# Patient Record
Sex: Male | Born: 1940 | Race: White | Hispanic: No | Marital: Married | Smoking: Never smoker
Health system: Southern US, Community
[De-identification: ages and names within clinical notes are randomized; demographics above are authoritative.]

## PROBLEM LIST (undated history)

## (undated) DIAGNOSIS — M109 Gout, unspecified: Secondary | ICD-10-CM

## (undated) DIAGNOSIS — E119 Type 2 diabetes mellitus without complications: Secondary | ICD-10-CM

## (undated) DIAGNOSIS — I10 Essential (primary) hypertension: Secondary | ICD-10-CM

---

## 1967-05-28 HISTORY — PX: FINGER SURGERY: SHX640

## 1987-05-28 HISTORY — PX: BACK SURGERY: SHX140

## 2007-08-13 ENCOUNTER — Emergency Department (HOSPITAL_COMMUNITY): Admission: EM | Admit: 2007-08-13 | Discharge: 2007-08-13 | Payer: Self-pay | Admitting: Emergency Medicine

## 2010-02-20 ENCOUNTER — Encounter: Payer: Self-pay | Admitting: Nurse Practitioner

## 2010-02-24 ENCOUNTER — Encounter: Payer: Self-pay | Admitting: Nurse Practitioner

## 2010-03-27 ENCOUNTER — Encounter: Payer: Self-pay | Admitting: Nurse Practitioner

## 2010-04-26 ENCOUNTER — Encounter: Payer: Self-pay | Admitting: Nurse Practitioner

## 2012-03-30 ENCOUNTER — Encounter (HOSPITAL_COMMUNITY): Payer: Self-pay | Admitting: Emergency Medicine

## 2012-03-30 ENCOUNTER — Emergency Department (HOSPITAL_COMMUNITY): Payer: Medicare Other

## 2012-03-30 ENCOUNTER — Emergency Department (HOSPITAL_COMMUNITY)
Admission: EM | Admit: 2012-03-30 | Discharge: 2012-03-30 | Disposition: A | Discharge status: Home / Self Care | Payer: Medicare Other | Attending: Emergency Medicine | Admitting: Emergency Medicine

## 2012-03-30 DIAGNOSIS — M2540 Effusion, unspecified joint: Secondary | ICD-10-CM | POA: Insufficient documentation

## 2012-03-30 DIAGNOSIS — Z7982 Long term (current) use of aspirin: Secondary | ICD-10-CM | POA: Insufficient documentation

## 2012-03-30 DIAGNOSIS — X58XXXA Exposure to other specified factors, initial encounter: Secondary | ICD-10-CM | POA: Insufficient documentation

## 2012-03-30 DIAGNOSIS — Z79899 Other long term (current) drug therapy: Secondary | ICD-10-CM | POA: Insufficient documentation

## 2012-03-30 DIAGNOSIS — Y9339 Activity, other involving climbing, rappelling and jumping off: Secondary | ICD-10-CM | POA: Insufficient documentation

## 2012-03-30 DIAGNOSIS — S82843A Displaced bimalleolar fracture of unspecified lower leg, initial encounter for closed fracture: Secondary | ICD-10-CM

## 2012-03-30 DIAGNOSIS — E119 Type 2 diabetes mellitus without complications: Secondary | ICD-10-CM | POA: Insufficient documentation

## 2012-03-30 DIAGNOSIS — Y929 Unspecified place or not applicable: Secondary | ICD-10-CM | POA: Insufficient documentation

## 2012-03-30 HISTORY — DX: Type 2 diabetes mellitus without complications: E11.9

## 2012-03-30 MED ORDER — HYDROCODONE-ACETAMINOPHEN 5-325 MG PO TABS: 1.0000 | ORAL_TABLET | Freq: Four times a day (QID) | ORAL | Status: DC | PRN

## 2012-03-30 NOTE — ED Notes (Signed)
Pt c/o pain//swelling to right ankle after falling today.

## 2012-03-30 NOTE — ED Provider Notes (Signed)
History   This chart was scribed for Bobby Jakes, MD, by Frederik Pear. The patient was seen in room APA12/APA12 and the patient's care was started at 1837.    CSN: [id number]  Arrival date & time 03/30/12  4098   First MD Initiated Contact with Patient 03/30/12 1837      Chief Complaint  Patient presents with  . Ankle Pain    (Consider location/radiation/quality/duration/timing/severity/associated sxs/prior treatment) HPI Comments: Bobby Donovan is a 71 y.o. Male with a h/o of DM who presents to the Emergency Department complaining of constant, moderate, non-radiating right ankle pain after trying to jump across a ditch earlier today. He currently takes metformin for his DM.    PCP is Dr. Ninfa Linden.  The history is provided by the patient.    Past Medical History  Diagnosis Date  . Diabetes mellitus without complication     Past Surgical History  Procedure Date  . Back surgery     No family history on file.  History  Substance Use Topics  . Smoking status: Never Smoker   . Smokeless tobacco: Not on file  . Alcohol Use:       Review of Systems  Constitutional: Negative for fever.  HENT: Negative for congestion and sore throat.   Eyes: Negative for visual disturbance.  Respiratory: Negative for cough and shortness of breath.   Cardiovascular: Negative for chest pain.  Gastrointestinal: Negative for nausea, vomiting and diarrhea.  Musculoskeletal: Positive for joint swelling.  Skin: Negative for rash.       Edema  Neurological: Negative for headaches.  All other systems reviewed and are negative.    Allergies  Review of patient's allergies indicates no known allergies.  Home Medications   Current Outpatient Rx  Name  Route  Sig  Dispense  Refill  . AMLODIPINE BESYLATE 5 MG PO TABS   Oral   Take 5 mg by mouth daily with supper.         . ASPIRIN EC 81 MG PO TBEC   Oral   Take 81 mg by mouth daily with supper.         .  IRBESARTAN 300 MG PO TABS   Oral   Take 300 mg by mouth daily with supper.         Marland Kitchen LOSARTAN POTASSIUM 100 MG PO TABS   Oral   Take 100 mg by mouth daily with supper.         Marland Kitchen METFORMIN HCL 500 MG PO TABS   Oral   Take 1,000 mg by mouth daily with supper.         Marland Kitchen PIOGLITAZONE HCL 30 MG PO TABS   Oral   Take 30 mg by mouth daily with supper.         Marland Kitchen ROSUVASTATIN CALCIUM 20 MG PO TABS   Oral   Take 20 mg by mouth daily with supper.         Marland Kitchen HYDROCODONE-ACETAMINOPHEN 5-325 MG PO TABS   Oral   Take 1-2 tablets by mouth every 6 (six) hours as needed for pain.   10 tablet   0     BP 175/87  Pulse 100  Temp 98.4 F (36.9 C)  Resp 20  Ht 5\' 7"  (1.702 m)  Wt 170 lb (77.111 kg)  BMI 26.63 kg/m2  SpO2 98%  Physical Exam  Constitutional: He is oriented to person, place, and time. He appears well-developed and well-nourished.  Eyes: Conjunctivae normal  and EOM are normal. Pupils are equal, round, and reactive to light. Right eye exhibits no discharge. Left eye exhibits no discharge. No scleral icterus.  Neck: Normal range of motion.  Cardiovascular: Normal rate, regular rhythm and normal heart sounds.   No murmur heard. Pulmonary/Chest: Effort normal and breath sounds normal. No respiratory distress. He has no wheezes. He has no rales. He exhibits no tenderness.  Abdominal: Soft. Bowel sounds are normal. He exhibits no mass. There is no tenderness. There is no rebound and no guarding.  Musculoskeletal: He exhibits edema.       Right hip: He exhibits no tenderness.       Left hip: He exhibits no tenderness.       Swelling medially and laterally around the right ankle with ecchymosis developing. No proximal fibula tenderness.  Neurological: He is alert and oriented to person, place, and time. No cranial nerve deficit. He exhibits normal muscle tone. Coordination normal.  Skin: No rash noted.       Capillary refill is 1 sec. on his right great toe.    ED Course   Procedures (including critical care time)  DIAGNOSTIC STUDIES: Oxygen Saturation is 98% on room air, normal by my interpretation.    COORDINATION OF CARE:  19:06- Discussed planned course of treatment with the patient, including a consult with an orthopaedic surgeon and a possible splint, who is agreeable at this time.  19:34- Consult with orthopaedic surgeon to schedule an appointment at 0830 tomorrow.  Labs Reviewed - No data to display Dg Ankle Complete Right  03/30/2012  *RADIOLOGY REPORT*  Clinical Data: Fall tonight. Rt ankle pain and swelling  RIGHT ANKLE - COMPLETE 3+ VIEW  Comparison: 08/13/2007  Findings: Comminuted and fragmented lateral malleolar fracture noted, primarily with oblique morphology of the dominant plane but with multiple fragments along the main fracture plane, with lateral displacement of the distal fragments. Primarily transverse fracture of the medial malleolus.  Tibial and fibular shafts are displaced medially with respect to the talus.  No discrete talar dome lesion noted.  A well-defined posterior malleolar fracture is not well seen on the lateral projection.  Vascular calcifications noted.  Adjacent soft tissue swelling is present.  IMPRESSION:  1.  Bimalleolar fracture, with particular comminution of the lateral malleolar component, most compatible with Lauge-Hansen supination - external rotation stage IV injury.   Original Report Authenticated By: Gaylyn Rong, M.D.      1. Bimalleolar ankle fracture       MDM  Right bimlleolar  ankle fracture discussed with Dr. Romeo Apple he will see the patient is off this tomorrow at 8:30 in the morning. We'll do a stirrup splint and provide crutches patient will elevate the leg. No other injuries.       I personally performed the services described in this documentation, which was scribed in my presence. The recorded information has been reviewed and considered.          Bobby Jakes,  MD 03/30/12 [phone]

## 2012-03-30 NOTE — ED Notes (Signed)
Pt with pain to right ankle after jumping a ditch today and fell, states that his right ankle had twisted with landing, unable to place weight on ankle

## 2012-03-31 ENCOUNTER — Encounter: Payer: Self-pay | Admitting: Orthopedic Surgery

## 2012-03-31 ENCOUNTER — Encounter (HOSPITAL_COMMUNITY): Payer: Self-pay | Admitting: Pharmacy Technician

## 2012-03-31 ENCOUNTER — Ambulatory Visit (INDEPENDENT_AMBULATORY_CARE_PROVIDER_SITE_OTHER): Payer: Medicare Other | Admitting: Orthopedic Surgery

## 2012-03-31 ENCOUNTER — Telehealth: Payer: Self-pay | Admitting: Orthopedic Surgery

## 2012-03-31 DIAGNOSIS — S82843A Displaced bimalleolar fracture of unspecified lower leg, initial encounter for closed fracture: Secondary | ICD-10-CM

## 2012-03-31 NOTE — Telephone Encounter (Signed)
Contact to insurers regarding out-patient surgery scheduled at [facility] on 04/03/12, repair of right ankle fracture, CPT [phone].  Per Medicare guidelines, no pre-authorization required; per [facility], ph [phone]:

## 2012-03-31 NOTE — Patient Instructions (Addendum)
20 Bobby Donovan  03/31/2012   Your procedure is scheduled on:  04/03/2012  Report to [facility] at  1130  AM.  Call this number if you have problems the morning of surgery: [phone]   Remember:   Do not eat food:After Midnight.  May have clear liquids:until Midnight .    Take these medicines the morning of surgery with A SIP OF WATER:  Norco,allopurinol,norvasc   Do not wear jewelry, make-up or nail polish.  Do not wear lotions, powders, or perfumes.   Do not shave 48 hours prior to surgery. Men may shave face and neck.  Do not bring valuables to the hospital.  Contacts, dentures or bridgework may not be worn into surgery.  Leave suitcase in the car. After surgery it may be brought to your room.  For patients admitted to the hospital, checkout time is 11:00 AM the day of discharge.   Patients discharged the day of surgery will not be allowed to drive home.  Name and phone number of your driver: family  Special Instructions: Shower using CHG 2 nights before surgery and the night before surgery.  If you shower the day of surgery use CHG.  Use special wash - you have one bottle of CHG for all showers.  You should use approximately 1/3 of the bottle for each shower.   Please read over the following fact sheets that you were given: Pain Booklet, Coughing and Deep Breathing, Surgical Site Infection Prevention, Anesthesia Post-op Instructions and Care and Recovery After Surgery Displaced Fibular Fracture (Adult, Ankle) Treated with ORIF You have a fracture (break) of your fibula at the end of this bone that makes up part of your ankle. This is the bone in your lower leg located on the outside of the leg and it makes up the bump you feel on the outside of your ankle. The fracture you have is displaced. This means the bones are not in good healing position. Your displaced fracture is at the part of the fibula that is located at the ankle. Because of this the bones must be put back into  position surgically. This is called ORIF. ORIF stands for Open Reduction and Internal Fixation. This surgical repair of your ankle will give the best chance for your ankle to heal right and decrease the chances of later arthritis and disability, which may occur with even the best of treatment and care. These fractures are easily diagnosed with x-rays. LET YOUR CAREGIVER KNOW ABOUT:  Allergies  Medications taken including herbs, eye drops, over the counter medications, and creams  Use of steroids (by mouth or creams)  History of bleeding or blood problems  Previous problems with anesthetics or novocaine  History of blood clots (thrombophlebitis)  Previous surgery.  Other health problems  Family history of anesthetic problems.  Possibility of pregnancy, if this applies RISKS AND COMPLICATIONS All surgery is associated with risks. Some of these risks are:  Excessive bleeding.  Infection.  Post traumatic arthritis.  Failure to heal properly resulting in an unstable ankle.  Stiffness of ankle following repair. BEFORE THE PROCEDURE Prior to surgery an IV (intravenous line connected to your vein for giving fluids) may be started and you will be given an anesthetic (medications and gas to make you sleep).  AFTER THE PROCEDURE This is usually an outpatient procedure. This means you will be released from the hospital the same day. You will receive physical therapy and other care until you are doing  well. HOME CARE INSTRUCTIONS   You may resume normal diet and activities as directed or allowed.  Keep ice packs (a bag of ice wrapped in a towel) on the surgical area for twenty minutes, four times per day, for the first two days following surgery.  Change dressings if necessary or as directed.  If you have a plaster or fiberglass cast:  Do not try to scratch the skin under the cast using sharp or pointed objects.  Check the skin around the cast every day. You may put lotion on any  red or sore areas.  Keep your cast dry and clean.  Do not put pressure on any part of your cast or splint until it is fully hardened.  Your cast or splint can be protected during bathing with a plastic bag. Do not lower the cast or splint into water.  Only take over-the-counter or prescription medicines for pain, discomfort, or fever as directed by your caregiver.  Use crutches as directed and do not exercise leg unless instructed.  Your caregiver may instruct you to remove your cam boot.  These are not fractures to be taken lightly! If these bones become displaced and get out of position, it may eventually lead to arthritis and disability. Problems often follow even the best of care. Follow the directions of your caregiver.  Keep appointments as directed.  Warning: Do not drive a car or operate a motor vehicle until your caregiver specifically tells you it is safe to do so. SEEK IMMEDIATE MEDICAL CARE IF:   Redness, swelling, or increasing pain in the wound.  Pus coming from wound.  An unexplained oral temperature above 102 F (38.9 C) develops.  A bad smell coming from the wound or dressing.  A breaking open of the wound (edges not staying together) after sutures or staples have been removed.  Numbness in the foot that is getting worse.  Severe pain when you move your toes. If you do not have a window in your cast for observing the wound, a discharge or minor bleeding may show up as a stain on the outside of your cast. Report these findings to your caregiver. MAKE SURE YOU:   Understand these instructions.  Will watch your condition.  Will get help right away if you are not doing well or get worse. Document Released: 05/13/2005 Document Revised: 08/05/2011 Document Reviewed: 08/18/2007 [facility] Patient Information 2013 [place], [place]. PATIENT INSTRUCTIONS POST-ANESTHESIA  IMMEDIATELY FOLLOWING SURGERY:  Do not drive or operate machinery for the first twenty four  hours after surgery.  Do not make any important decisions for twenty four hours after surgery or while taking narcotic pain medications or sedatives.  If you develop intractable nausea and vomiting or a severe headache please notify your doctor immediately.  FOLLOW-UP:  Please make an appointment with your surgeon as instructed. You do not need to follow up with anesthesia unless specifically instructed to do so.  WOUND CARE INSTRUCTIONS (if applicable):  Keep a dry clean dressing on the anesthesia/puncture wound site if there is drainage.  Once the wound has quit draining you may leave it open to air.  Generally you should leave the bandage intact for twenty four hours unless there is drainage.  If the epidural site drains for more than 36-48 hours please call the anesthesia department.  QUESTIONS?:  Please feel free to call your physician or the hospital operator if you have any questions, and they will be happy to assist you.

## 2012-03-31 NOTE — Progress Notes (Signed)
Patient ID: Bobby Donovan, male   DOB: 08/24/1940, 70 y.o.   MRN: [id number] Chief Complaint   Patient presents with   .  Ankle Injury       Right ankle fracture, DOI 03-30-12.        70-year-old male with diabetes tried to jump over a small remaining twisted his ankle felt immediate pain and swelling with deformity and went to the ER. In the ER x-rays show bimalleolar ankle fracture with slight lateral displacement comminuted fibular fracture and a displaced medial malleolar fracture.   He complains of pain swelling inability to weight-bear located over the medial lateral aspects of his right ankle.    Past Medical History   Diagnosis  Date   .  Diabetes mellitus without complication           Past Surgical History   Procedure  Date   .  Back surgery           Current Outpatient Prescriptions on File Prior to Visit   Medication  Sig  Dispense  Refill   .  amLODipine (NORVASC) 5 MG tablet  Take 5 mg by mouth daily with supper.         .  aspirin EC 81 MG tablet  Take 81 mg by mouth daily with supper.         .  losartan (COZAAR) 100 MG tablet  Take 100 mg by mouth daily with supper.         .  metFORMIN (GLUCOPHAGE) 500 MG tablet  Take 1,000 mg by mouth daily with supper.         .  pioglitazone (ACTOS) 30 MG tablet  Take 30 mg by mouth daily with supper.         .  rosuvastatin (CRESTOR) 20 MG tablet  Take 20 mg by mouth daily with supper.         .  allopurinol (ZYLOPRIM) 300 MG tablet  Take 300 mg by mouth daily with supper.         .  HYDROcodone-acetaminophen (NORCO/VICODIN) 5-325 MG per tablet  Take 1-2 tablets by mouth every 6 (six) hours as needed for pain.   10 tablet   0         History   Substance Use Topics   .  Smoking status:  Never Smoker    .  Smokeless tobacco:  Not on file   .  Alcohol Use:         Family History   Problem  Relation  Age of Onset   .  Diabetes          Physical Exam  Nursing note and vitals reviewed. Constitutional: He is  oriented to person, place, and time. He appears well-developed and well-nourished. No distress.  HENT:   Head: Normocephalic and atraumatic.   Nose: Nose normal.  Eyes: Pupils are equal, round, and reactive to light. Right eye exhibits no discharge. Left eye exhibits no discharge.  Neck: Normal range of motion.  Cardiovascular: Normal rate and intact distal pulses.   Pulmonary/Chest: Breath sounds normal. He has no wheezes. He has no rales.  Abdominal: He exhibits no distension.  Musculoskeletal:       Upper extremity exam  Inspection and palpation revealed no abnormalities in the upper extremities.  Range of motion is full without contracture.  Motor exam is normal with grade 5 strength.  The joints are fully reduced without subluxation.  There is no atrophy   or tremor and muscle tone is normal.  All joints are stable.    The left lower extremity is without contracture subluxation atrophy tremor or malalignment  Right lower extremity swollen tender medially and laterally no fracture blisters skin intact decreased range of motion decreased strength. No joint subluxation. Muscle tone normal.  Neurological: He is alert and oriented to person, place, and time. He has normal reflexes.  Skin: Skin is warm and dry. No rash noted. He is not diaphoretic. No erythema. No pallor.  Psychiatric: He has a normal mood and affect. His behavior is normal. Judgment and thought content normal.    Hospital films medial malleolar fracture lateral malleolar fracture displacement of the mortise   Diagnosis closed bimalleolar right ankle fracture   Plan open treatment internal fixation right ankle   Patient splint reapplied   Surgery scheduled for Friday      

## 2012-03-31 NOTE — Patient Instructions (Addendum)
Surgery right ankle: Friday   Bimalleolar Fracture, Ankle, Adult, Displaced (ORIF) A bimalleolar fracture (break in bone) is two fractures in the lower bones of your leg that help to make up your ankle. These fractures are in the bone you feel as the bump on the outside of your ankle (fibula) and the bone that you feel as the bump on the inside of your ankle (tibia). Your fractures are displaced. This means the bones are not in their normal position and will not give a good result if they heal in that position. Because of this, surgery is required. This is called an open reduction and internal fixation (ORIF). Even with the best of care and perfect results this ankle may be more prone to be arthritis later due to damage of the cartilage lining the ankle joint which is not visible on X-ray. These fractures are easily diagnosed with X-rays. TREATMENT   You have fractures that would probably heal with disability, without surgery. Open reduction means that the area of the fracture is opened up to the vision of the surgeon and internal fixation means that a screw, pins or fixation device is used to hold the boney pieces in place. Following surgery a short-leg cast or removable fracture boot is then applied from your toes to below your knee. This is generally left in place for about 5 to 6 weeks, during which time it is followed by your caregiver and X-rays may be taken to make sure the bones stay in place. RISKS & COMPLICATIONS: All surgery is associated with risks. Some of these risks are:  Excessive bleeding   Infection   Failure to heal properly resulting in an unstable or arthritic ankle   Stiffness of ankle following repair  LET YOUR CAREGIVERS KNOW ABOUT:  Allergies.   Medications taken including herbs, eye drops, over-the-counter medications, and creams.   Use of steroids (by mouth or creams).   History of bleeding or blood problems.   Previous problems with anesthetics or numbing  medication, including a family history of these problems.   Possibility of pregnancy, if this applies.   History of blood clots (thrombophlebitis).   Previous surgery.   Other health problems.  BEFORE AND AFTER YOUR SURGERY Prior to surgery an IV (intravenous line connected to your vein for giving fluids) may be started and you will be given an anesthetic (medications and gas to make you sleep). You may also be given a regional anesthetic such as a spinal or epidural block. After surgery, you will be taken to the recovery area where a nurse will monitor your progress. You may have a catheter (a long, narrow, hollow tube) in your bladder following surgery that helps you pass your water. When you are awake, are stable, taking fluids well and without complications, you will be returned to your room. You will receive physical therapy and other care until you are doing well and your caregiver feels it is safe for you to be transferred either to home or to an extended care facility. HOME CARE INSTRUCTIONS    You may resume normal diet and activities as directed or allowed.   Do not drive a vehicle until your caregiver specifically tells you it is safe to do so.   Keep ice packs (a bag of ice wrapped in a towel) on the surgical area for 20 minutes, 4 times per day, for the first two days following surgery. Use the ice only if okay with your surgeon or caregiver.  Elevate your ankle above your heart as much as possible for the first 24 to 48 hours after the operation.   Change dressings if necessary or as directed.   If you have a plaster or fiberglass cast:   Do not try to scratch the skin under the cast using sharp or pointed objects.   Check the skin around the cast every day. You may put lotion on any red or sore areas.   Keep your cast dry and clean.   Do not put pressure on any part of your cast or splint until it is fully hardened.   Your cast or splint can be protected during  bathing with a plastic bag. Do not lower the cast or splint into water.   Only take over-the-counter or prescription medicines for pain, discomfort, or fever as directed by your caregiver.   Use crutches as directed and do not exercise leg unless instructed.   These are not fractures to be taken lightly! If these bones become displaced and get out of position, it may eventually lead to arthritis and disability for the rest of your life. Problems often follow even the best of care. Follow the directions of your caregiver.   Keep appointments as directed.  SEEK IMMEDIATE MEDICAL CARE IF:    Redness, swelling, numbness, or increasing pain in the wound.   Pus coming from wound.   An unexplained oral temperature above 102 F (38.9 C) or as your caregiver suggests.   A bad smell coming from the wound or dressing.   A breaking open of the wound (edges not staying together) after sutures or staples have been removed.   Your skin or nails below the injury turn blue or gray, or feel cold or numb.   You develop severe pain under the cast or in your foot. Especially when someone else moves your toes.  Follow all instructions given to you by your caregiver, make and keep follow up appointments, and use crutches as directed. If you do not have a window in your cast for observing the wound, a discharge, or minor bleeding may show up as a stain on the outside of your dressings, your cast, or plaster splint. Report these findings to your caregiver. MAKE SURE YOU:    Understand these instructions.   Will watch your condition.   Will get help right away if you are not doing well or get worse.  Document Released: 02/20/2005 Document Revised: 08/05/2011 Document Reviewed: 12/16/2007 [facility] Patient Information 2013 [place], [place].

## 2012-03-31 NOTE — H&P (Signed)
Patient ID: Bobby Donovan, male   DOB: 07/08/1940, 70 y.o.   MRN: [id number] Chief Complaint   Patient presents with   .  Ankle Injury       Right ankle fracture, DOI 03-30-12.        70-year-old male with diabetes tried to jump over a small remaining twisted his ankle felt immediate pain and swelling with deformity and went to the ER. In the ER x-rays show bimalleolar ankle fracture with slight lateral displacement comminuted fibular fracture and a displaced medial malleolar fracture.   He complains of pain swelling inability to weight-bear located over the medial lateral aspects of his right ankle.    Past Medical History   Diagnosis  Date   .  Diabetes mellitus without complication           Past Surgical History   Procedure  Date   .  Back surgery           Current Outpatient Prescriptions on File Prior to Visit   Medication  Sig  Dispense  Refill   .  amLODipine (NORVASC) 5 MG tablet  Take 5 mg by mouth daily with supper.         .  aspirin EC 81 MG tablet  Take 81 mg by mouth daily with supper.         .  losartan (COZAAR) 100 MG tablet  Take 100 mg by mouth daily with supper.         .  metFORMIN (GLUCOPHAGE) 500 MG tablet  Take 1,000 mg by mouth daily with supper.         .  pioglitazone (ACTOS) 30 MG tablet  Take 30 mg by mouth daily with supper.         .  rosuvastatin (CRESTOR) 20 MG tablet  Take 20 mg by mouth daily with supper.         .  allopurinol (ZYLOPRIM) 300 MG tablet  Take 300 mg by mouth daily with supper.         .  HYDROcodone-acetaminophen (NORCO/VICODIN) 5-325 MG per tablet  Take 1-2 tablets by mouth every 6 (six) hours as needed for pain.   10 tablet   0         History   Substance Use Topics   .  Smoking status:  Never Smoker    .  Smokeless tobacco:  Not on file   .  Alcohol Use:         Family History   Problem  Relation  Age of Onset   .  Diabetes          Physical Exam  Nursing note and vitals reviewed. Constitutional: He is  oriented to person, place, and time. He appears well-developed and well-nourished. No distress.  HENT:   Head: Normocephalic and atraumatic.   Nose: Nose normal.  Eyes: Pupils are equal, round, and reactive to light. Right eye exhibits no discharge. Left eye exhibits no discharge.  Neck: Normal range of motion.  Cardiovascular: Normal rate and intact distal pulses.   Pulmonary/Chest: Breath sounds normal. He has no wheezes. He has no rales.  Abdominal: He exhibits no distension.  Musculoskeletal:       Upper extremity exam  Inspection and palpation revealed no abnormalities in the upper extremities.  Range of motion is full without contracture.  Motor exam is normal with grade 5 strength.  The joints are fully reduced without subluxation.  There is no atrophy   or tremor and muscle tone is normal.  All joints are stable.    The left lower extremity is without contracture subluxation atrophy tremor or malalignment  Right lower extremity swollen tender medially and laterally no fracture blisters skin intact decreased range of motion decreased strength. No joint subluxation. Muscle tone normal.  Neurological: He is alert and oriented to person, place, and time. He has normal reflexes.  Skin: Skin is warm and dry. No rash noted. He is not diaphoretic. No erythema. No pallor.  Psychiatric: He has a normal mood and affect. His behavior is normal. Judgment and thought content normal.    Hospital films medial malleolar fracture lateral malleolar fracture displacement of the mortise   Diagnosis closed bimalleolar right ankle fracture   Plan open treatment internal fixation right ankle   Patient splint reapplied   Surgery scheduled for Friday      

## 2012-04-01 ENCOUNTER — Encounter (HOSPITAL_COMMUNITY)
Admission: RE | Admit: 2012-04-01 | Discharge: 2012-04-01 | Disposition: A | Discharge status: Home / Self Care | Payer: Medicare Other | Source: Ambulatory Visit | Attending: Orthopedic Surgery | Admitting: Orthopedic Surgery

## 2012-04-01 ENCOUNTER — Encounter (HOSPITAL_COMMUNITY): Payer: Self-pay

## 2012-04-01 HISTORY — DX: Essential (primary) hypertension: I10

## 2012-04-01 HISTORY — DX: Gout, unspecified: M10.9

## 2012-04-01 LAB — SURGICAL PCR SCREEN
MRSA, PCR: NEGATIVE
Staphylococcus aureus: NEGATIVE

## 2012-04-01 LAB — BASIC METABOLIC PANEL
CO2: 27 mEq/L (ref 19–32)
Calcium: 9.3 mg/dL (ref 8.4–10.5)
GFR calc non Af Amer: 44 mL/min — ABNORMAL LOW (ref >90)
Sodium: 139 mEq/L (ref 135–145)

## 2012-04-01 LAB — CBC
Platelets: 170 10*3/uL (ref 150–400)
RBC: 3.13 MIL/uL — ABNORMAL LOW (ref 4.22–5.81)
WBC: 6.3 10*3/uL (ref 4.0–10.5)

## 2012-04-01 MED ORDER — CHLORHEXIDINE GLUCONATE 4 % EX LIQD: 60.0000 mL | Freq: Once | CUTANEOUS | Status: DC

## 2012-04-01 NOTE — Telephone Encounter (Signed)
04/01/12 - Re-verified with representative at [facility], Tangamika J, in addition to automated voice response system message, confirmed that no pre-authorization is required for surgery (CPT code 16109.)

## 2012-04-01 NOTE — Progress Notes (Signed)
04/01/12 0851  OBSTRUCTIVE SLEEP APNEA  Have you ever been diagnosed with sleep apnea through a sleep study? No  Do you snore loudly (loud enough to be heard through closed doors)?  1  Do you often feel tired, fatigued, or sleepy during the daytime? 0  Has anyone observed you stop breathing during your sleep? 1  Do you have, or are you being treated for high blood pressure? 1  BMI more than 35 kg/m2? 0  Age over 70 years old? 1  Neck circumference greater than 40 cm/18 inches? 0  Gender: 1  Obstructive Sleep Apnea Score 5   Score 4 or greater  Results sent to PCP

## 2012-04-01 NOTE — Pre-Procedure Instructions (Signed)
Dr Romeo Apple notified of Hem 10, Hct 30.7. No orders given at this time.

## 2012-04-03 ENCOUNTER — Encounter (HOSPITAL_COMMUNITY)
Admission: RE | Disposition: A | Discharge status: Home / Self Care | Payer: Self-pay | Source: Ambulatory Visit | Attending: Orthopedic Surgery

## 2012-04-03 ENCOUNTER — Ambulatory Visit (HOSPITAL_COMMUNITY): Payer: Medicare Other | Admitting: Anesthesiology

## 2012-04-03 ENCOUNTER — Ambulatory Visit (HOSPITAL_COMMUNITY)
Admission: RE | Admit: 2012-04-03 | Discharge: 2012-04-03 | Disposition: A | Discharge status: Home / Self Care | Payer: Medicare Other | Source: Ambulatory Visit | Attending: Orthopedic Surgery | Admitting: Orthopedic Surgery

## 2012-04-03 ENCOUNTER — Encounter (HOSPITAL_COMMUNITY): Payer: Self-pay | Admitting: *Deleted

## 2012-04-03 ENCOUNTER — Ambulatory Visit (HOSPITAL_COMMUNITY): Payer: Medicare Other

## 2012-04-03 ENCOUNTER — Encounter (HOSPITAL_COMMUNITY): Payer: Self-pay | Admitting: Anesthesiology

## 2012-04-03 DIAGNOSIS — E119 Type 2 diabetes mellitus without complications: Secondary | ICD-10-CM | POA: Insufficient documentation

## 2012-04-03 DIAGNOSIS — I10 Essential (primary) hypertension: Secondary | ICD-10-CM | POA: Insufficient documentation

## 2012-04-03 DIAGNOSIS — S82843A Displaced bimalleolar fracture of unspecified lower leg, initial encounter for closed fracture: Secondary | ICD-10-CM | POA: Insufficient documentation

## 2012-04-03 DIAGNOSIS — Z01810 Encounter for preprocedural cardiovascular examination: Secondary | ICD-10-CM | POA: Insufficient documentation

## 2012-04-03 DIAGNOSIS — Z01812 Encounter for preprocedural laboratory examination: Secondary | ICD-10-CM | POA: Insufficient documentation

## 2012-04-03 DIAGNOSIS — X500XXA Overexertion from strenuous movement or load, initial encounter: Secondary | ICD-10-CM | POA: Insufficient documentation

## 2012-04-03 HISTORY — PX: ORIF ANKLE FRACTURE: SHX5408

## 2012-04-03 HISTORY — PX: ANKLE FRACTURE SURGERY: SHX122

## 2012-04-03 SURGERY — OPEN REDUCTION INTERNAL FIXATION (ORIF) ANKLE FRACTURE
Anesthesia: Spinal | Site: Ankle | Laterality: Right | Wound class: Clean

## 2012-04-03 MED ORDER — BUPIVACAINE IN DEXTROSE 0.75-8.25 % IT SOLN
INTRATHECAL | Status: DC | PRN
  Administered 2012-04-03: 15 mg via INTRATHECAL

## 2012-04-03 MED ORDER — BUPIVACAINE-EPINEPHRINE PF 0.5-1:200000 % IJ SOLN
INTRAMUSCULAR | Status: AC
  Filled 2012-04-03: qty 20

## 2012-04-03 MED ORDER — FENTANYL CITRATE 0.05 MG/ML IJ SOLN
INTRAMUSCULAR | Status: AC
  Filled 2012-04-03: qty 2

## 2012-04-03 MED ORDER — MIDAZOLAM HCL 2 MG/2ML IJ SOLN
1.0000 mg | INTRAMUSCULAR | Status: DC | PRN
  Administered 2012-04-03: 1 mg via INTRAVENOUS
  Administered 2012-04-03: 2 mg via INTRAVENOUS

## 2012-04-03 MED ORDER — PROMETHAZINE HCL 12.5 MG PO TABS: 60.0000 mg | ORAL_TABLET | ORAL | Status: DC | PRN

## 2012-04-03 MED ORDER — PROPOFOL 10 MG/ML IV EMUL
INTRAVENOUS | Status: DC | PRN
  Administered 2012-04-03: 15:00:00 via INTRAVENOUS
  Administered 2012-04-03: 3 ug/kg/min via INTRAVENOUS

## 2012-04-03 MED ORDER — OXYCODONE-ACETAMINOPHEN 5-325 MG PO TABS: 1.0000 | ORAL_TABLET | ORAL | Status: DC | PRN

## 2012-04-03 MED ORDER — MIDAZOLAM HCL 2 MG/2ML IJ SOLN
INTRAMUSCULAR | Status: AC
  Filled 2012-04-03: qty 2

## 2012-04-03 MED ORDER — PROPOFOL INFUSION 10 MG/ML OPTIME: INTRAVENOUS | Status: DC | PRN

## 2012-04-03 MED ORDER — BUPIVACAINE-EPINEPHRINE PF 0.5-1:200000 % IJ SOLN
INTRAMUSCULAR | Status: DC | PRN
  Administered 2012-04-03: 60 mL

## 2012-04-03 MED ORDER — ONDANSETRON HCL 4 MG/2ML IJ SOLN: 4.0000 mg | Freq: Once | INTRAMUSCULAR | Status: DC | PRN

## 2012-04-03 MED ORDER — PROPOFOL 10 MG/ML IV EMUL
INTRAVENOUS | Status: AC
  Filled 2012-04-03: qty 20

## 2012-04-03 MED ORDER — EPHEDRINE SULFATE 50 MG/ML IJ SOLN
INTRAMUSCULAR | Status: AC
  Filled 2012-04-03: qty 1

## 2012-04-03 MED ORDER — CEFAZOLIN SODIUM-DEXTROSE 2-3 GM-% IV SOLR
INTRAVENOUS | Status: AC
  Filled 2012-04-03: qty 50

## 2012-04-03 MED ORDER — FENTANYL CITRATE 0.05 MG/ML IJ SOLN
INTRAMUSCULAR | Status: DC | PRN
  Administered 2012-04-03: 25 ug via INTRAVENOUS

## 2012-04-03 MED ORDER — SODIUM CHLORIDE 0.9 % IR SOLN
Status: DC | PRN
  Administered 2012-04-03: 1000 mL

## 2012-04-03 MED ORDER — MIDAZOLAM HCL 5 MG/5ML IJ SOLN
INTRAMUSCULAR | Status: DC | PRN
  Administered 2012-04-03 (×2): 1 mg via INTRAVENOUS

## 2012-04-03 MED ORDER — ACETAMINOPHEN 10 MG/ML IV SOLN
1000.0000 mg | Freq: Once | INTRAVENOUS | Status: AC
  Administered 2012-04-03: 1000 mg via INTRAVENOUS

## 2012-04-03 MED ORDER — CEFAZOLIN SODIUM-DEXTROSE 2-3 GM-% IV SOLR
INTRAVENOUS | Status: DC | PRN
  Administered 2012-04-03: 2 g via INTRAVENOUS

## 2012-04-03 MED ORDER — LACTATED RINGERS IV SOLN
INTRAVENOUS | Status: DC
  Administered 2012-04-03 (×2): via INTRAVENOUS

## 2012-04-03 MED ORDER — OXYCODONE HCL 5 MG PO TABS
ORAL_TABLET | ORAL | Status: AC
  Filled 2012-04-03: qty 1

## 2012-04-03 MED ORDER — CEFAZOLIN SODIUM-DEXTROSE 2-3 GM-% IV SOLR: 2.0000 g | INTRAVENOUS | Status: DC

## 2012-04-03 MED ORDER — BUPIVACAINE IN DEXTROSE 0.75-8.25 % IT SOLN
INTRATHECAL | Status: AC
  Filled 2012-04-03: qty 2

## 2012-04-03 MED ORDER — FENTANYL CITRATE 0.05 MG/ML IJ SOLN: 25.0000 ug | INTRAMUSCULAR | Status: DC | PRN

## 2012-04-03 MED ORDER — OXYCODONE HCL 5 MG PO TABS
5.0000 mg | ORAL_TABLET | Freq: Once | ORAL | Status: AC
  Administered 2012-04-03: 5 mg via ORAL

## 2012-04-03 MED ORDER — ACETAMINOPHEN 10 MG/ML IV SOLN
INTRAVENOUS | Status: AC
  Filled 2012-04-03: qty 100

## 2012-04-03 MED ORDER — EPHEDRINE SULFATE 50 MG/ML IJ SOLN
INTRAMUSCULAR | Status: DC | PRN
  Administered 2012-04-03: 5 mg via INTRAVENOUS

## 2012-04-03 SURGICAL SUPPLY — 58 items
BAG HAMPER (MISCELLANEOUS) ×2 IMPLANT
BANDAGE ELASTIC 4 VELCRO NS (GAUZE/BANDAGES/DRESSINGS) ×2 IMPLANT
BANDAGE ELASTIC 6 VELCRO NS (GAUZE/BANDAGES/DRESSINGS) ×2 IMPLANT
BANDAGE ESMARK 4X12 BL STRL LF (DISPOSABLE) ×1 IMPLANT
BIT DRILL CANN 2.7 (BIT) ×1
BIT DRILL SRG 2.7XCANN AO CPLG (BIT) ×1 IMPLANT
BIT DRL SRG 2.7XCANN AO CPLNG (BIT) ×1
BLADE SURG SZ10 CARB STEEL (BLADE) ×2 IMPLANT
BNDG COHESIVE 4X5 TAN STRL (GAUZE/BANDAGES/DRESSINGS) ×2 IMPLANT
BNDG ESMARK 4X12 BLUE STRL LF (DISPOSABLE) ×2
CHLORAPREP W/TINT 26ML (MISCELLANEOUS) ×4 IMPLANT
CLOTH BEACON ORANGE TIMEOUT ST (SAFETY) ×2 IMPLANT
COVER LIGHT HANDLE STERIS (MISCELLANEOUS) ×4 IMPLANT
CUFF TOURNIQUET SINGLE 34IN LL (TOURNIQUET CUFF) ×2 IMPLANT
DRAPE C-ARM FOLDED MOBILE STRL (DRAPES) ×2 IMPLANT
DRAPE PROXIMA HALF (DRAPES) ×2 IMPLANT
DRILL 2.6X122MM WL AO SHAFT (BIT) ×2 IMPLANT
GAUZE XEROFORM 5X9 LF (GAUZE/BANDAGES/DRESSINGS) ×4 IMPLANT
GLOVE BIOGEL PI IND STRL 7.0 (GLOVE) ×2 IMPLANT
GLOVE BIOGEL PI INDICATOR 7.0 (GLOVE) ×2
GLOVE ECLIPSE 6.5 STRL STRAW (GLOVE) ×2 IMPLANT
GLOVE EXAM NITRILE MD LF STRL (GLOVE) ×2 IMPLANT
GLOVE SKINSENSE NS SZ8.0 LF (GLOVE) ×1
GLOVE SKINSENSE STRL SZ8.0 LF (GLOVE) ×1 IMPLANT
GLOVE SS BIOGEL STRL SZ 6.5 (GLOVE) ×1 IMPLANT
GLOVE SS N UNI LF 8.5 STRL (GLOVE) ×2 IMPLANT
GLOVE SUPERSENSE BIOGEL SZ 6.5 (GLOVE) ×1
GOWN STRL REIN XL XLG (GOWN DISPOSABLE) ×4 IMPLANT
INST SET MINOR BONE (KITS) ×2 IMPLANT
K-WIRE ORTHOPEDIC 1.4X150L (WIRE) ×4
KIT ROOM TURNOVER APOR (KITS) ×2 IMPLANT
KWIRE ORTHOPEDIC 1.4X150L (WIRE) ×2 IMPLANT
MANIFOLD NEPTUNE II (INSTRUMENTS) ×2 IMPLANT
NEEDLE HYPO 21X1.5 SAFETY (NEEDLE) ×2 IMPLANT
NS IRRIG 1000ML POUR BTL (IV SOLUTION) ×4 IMPLANT
PACK BASIC LIMB (CUSTOM PROCEDURE TRAY) ×2 IMPLANT
PAD ABD 5X9 TENDERSORB (GAUZE/BANDAGES/DRESSINGS) ×4 IMPLANT
PAD ARMBOARD 7.5X6 YLW CONV (MISCELLANEOUS) ×2 IMPLANT
PAD CAST 4YDX4 CTTN HI CHSV (CAST SUPPLIES) ×1 IMPLANT
PADDING CAST COTTON 4X4 STRL (CAST SUPPLIES) ×1
PLATE FIBULA 4H (Plate) ×2 IMPLANT
SCREW BONE 14MMX3.5MM (Screw) ×6 IMPLANT
SCREW BONE 18 (Screw) ×2 IMPLANT
SCREW CANNULATED 4X48MM (Screw) ×4 IMPLANT
SCREW LOCK 3.5X14 (Screw) ×4 IMPLANT
SCREW LOCKING 3.5X16MM (Screw) ×2 IMPLANT
SET BASIN LINEN APH (SET/KITS/TRAYS/PACK) ×2 IMPLANT
SPLINT J IMMOBILIZER 4X20FT (CAST SUPPLIES) ×1 IMPLANT
SPLINT J PLASTER J 4INX20Y (CAST SUPPLIES) ×1
SPONGE GAUZE 4X4 12PLY (GAUZE/BANDAGES/DRESSINGS) ×2 IMPLANT
SPONGE LAP 18X18 X RAY DECT (DISPOSABLE) ×2 IMPLANT
STAPLER VISISTAT 35W (STAPLE) ×2 IMPLANT
SUT ETHILON 3 0 FSL (SUTURE) ×4 IMPLANT
SUT MON AB 0 CT1 (SUTURE) ×2 IMPLANT
SUT MON AB 2-0 CT1 36 (SUTURE) ×2 IMPLANT
SYR 30ML LL (SYRINGE) ×2 IMPLANT
SYR BULB IRRIGATION 50ML (SYRINGE) ×2 IMPLANT
Variax k-wire .045 ×2 IMPLANT

## 2012-04-03 NOTE — Anesthesia Procedure Notes (Addendum)
Spinal  Patient location during procedure: OR Start time: 04/03/2012 2:44 PM Staffing CRNA/Resident: Boleslaw Borghi J Preanesthetic Checklist Completed: patient identified, site marked, surgical consent, pre-op evaluation, timeout performed, IV checked, risks and benefits discussed and monitors and equipment checked Spinal Block Patient position: right lateral decubitus Prep: Betadine Patient monitoring: cardiac monitor, heart rate, continuous pulse ox and blood pressure Location: L2-3 Injection technique: single-shot Needle Needle type: Spinocan  Needle gauge: 22 G Assessment Sensory level: T6 Additional Notes CSF slow flow with small amount blood.CSF cleared after 5 seconds. [id number]     11/2012

## 2012-04-03 NOTE — Interval H&P Note (Signed)
History and Physical Interval Note:  04/03/2012 1:47 PM  Bobby Donovan  has presented today for surgery, with the diagnosis of closed bimalleolar right ankle fracture  The various methods of treatment have been discussed with the patient and family. After consideration of risks, benefits and other options for treatment, the patient has consented to  Procedure(s) (LRB) with comments: OPEN REDUCTION INTERNAL FIXATION (ORIF) ANKLE FRACTURE (Right) as a surgical intervention .  The patient's history has been reviewed, patient examined, no change in status, stable for surgery.  I have reviewed the patient's chart and labs.  Questions were answered to the patient's satisfaction.     Fuller Canada

## 2012-04-03 NOTE — Anesthesia Preprocedure Evaluation (Signed)
Anesthesia Evaluation  Patient identified by MRN, date of birth, ID band Patient awake    Reviewed: Allergy & Precautions, H&P , NPO status , Patient's Chart, lab work & pertinent test results  Airway Mallampati: I      Dental  (+) Edentulous Upper and Edentulous Lower   Pulmonary neg pulmonary ROS,  breath sounds clear to auscultation        Cardiovascular hypertension, Pt. on medications Rhythm:Regular Rate:Normal     Neuro/Psych    GI/Hepatic   Endo/Other  diabetes, Well Controlled, Type 2, Oral Hypoglycemic Agents  Renal/GU      Musculoskeletal   Abdominal   Peds  Hematology   Anesthesia Other Findings   Reproductive/Obstetrics                           Anesthesia Physical Anesthesia Plan  ASA: III  Anesthesia Plan: Spinal   Post-op Pain Management:    Induction:   Airway Management Planned: Nasal Cannula  Additional Equipment:   Intra-op Plan:   Post-operative Plan:   Informed Consent: I have reviewed the patients History and Physical, chart, labs and discussed the procedure including the risks, benefits and alternatives for the proposed anesthesia with the patient or authorized representative who has indicated his/her understanding and acceptance.     Plan Discussed with:   Anesthesia Plan Comments:         Anesthesia Quick Evaluation

## 2012-04-03 NOTE — Progress Notes (Signed)
Arouses easily to name. Oriented to place per nurse. Fob elevated. Ice pack to rt ankle.

## 2012-04-03 NOTE — H&P (View-Only) (Signed)
Patient ID: Bobby Donovan, male   DOB: 11-May-1941, 71 y.o.   MRN: [id number] Chief Complaint   Patient presents with   .  Ankle Injury       Right ankle fracture, DOI 03-30-12.        71 year old male with diabetes tried to jump over a small remaining twisted his ankle felt immediate pain and swelling with deformity and went to the ER. In the ER x-rays show bimalleolar ankle fracture with slight lateral displacement comminuted fibular fracture and a displaced medial malleolar fracture.   He complains of pain swelling inability to weight-bear located over the medial lateral aspects of his right ankle.    Past Medical History   Diagnosis  Date   .  Diabetes mellitus without complication           Past Surgical History   Procedure  Date   .  Back surgery           Current Outpatient Prescriptions on File Prior to Visit   Medication  Sig  Dispense  Refill   .  amLODipine (NORVASC) 5 MG tablet  Take 5 mg by mouth daily with supper.         Marland Kitchen  aspirin EC 81 MG tablet  Take 81 mg by mouth daily with supper.         .  losartan (COZAAR) 100 MG tablet  Take 100 mg by mouth daily with supper.         .  metFORMIN (GLUCOPHAGE) 500 MG tablet  Take 1,000 mg by mouth daily with supper.         .  pioglitazone (ACTOS) 30 MG tablet  Take 30 mg by mouth daily with supper.         .  rosuvastatin (CRESTOR) 20 MG tablet  Take 20 mg by mouth daily with supper.         Marland Kitchen  allopurinol (ZYLOPRIM) 300 MG tablet  Take 300 mg by mouth daily with supper.         Marland Kitchen  HYDROcodone-acetaminophen (NORCO/VICODIN) 5-325 MG per tablet  Take 1-2 tablets by mouth every 6 (six) hours as needed for pain.   10 tablet   0         History   Substance Use Topics   .  Smoking status:  Never Smoker    .  Smokeless tobacco:  Not on file   .  Alcohol Use:         Family History   Problem  Relation  Age of Onset   .  Diabetes          Physical Exam  Nursing note and vitals reviewed. Constitutional: He is  oriented to person, place, and time. He appears well-developed and well-nourished. No distress.  HENT:   Head: Normocephalic and atraumatic.   Nose: Nose normal.  Eyes: Pupils are equal, round, and reactive to light. Right eye exhibits no discharge. Left eye exhibits no discharge.  Neck: Normal range of motion.  Cardiovascular: Normal rate and intact distal pulses.   Pulmonary/Chest: Breath sounds normal. He has no wheezes. He has no rales.  Abdominal: He exhibits no distension.  Musculoskeletal:       Upper extremity exam  Inspection and palpation revealed no abnormalities in the upper extremities.  Range of motion is full without contracture.  Motor exam is normal with grade 5 strength.  The joints are fully reduced without subluxation.  There is no atrophy  or tremor and muscle tone is normal.  All joints are stable.    The left lower extremity is without contracture subluxation atrophy tremor or malalignment  Right lower extremity swollen tender medially and laterally no fracture blisters skin intact decreased range of motion decreased strength. No joint subluxation. Muscle tone normal.  Neurological: He is alert and oriented to person, place, and time. He has normal reflexes.  Skin: Skin is warm and dry. No rash noted. He is not diaphoretic. No erythema. No pallor.  Psychiatric: He has a normal mood and affect. His behavior is normal. Judgment and thought content normal.    Hospital films medial malleolar fracture lateral malleolar fracture displacement of the mortise   Diagnosis closed bimalleolar right ankle fracture   Plan open treatment internal fixation right ankle   Patient splint reapplied   Surgery scheduled for Friday

## 2012-04-03 NOTE — Progress Notes (Signed)
Resting quietly. Unable to pick bottom off bed. Returns to sleep.

## 2012-04-03 NOTE — Transfer of Care (Signed)
Immediate Anesthesia Transfer of Care Note  Patient: [name]  Procedure(s) Performed: Procedure(s) (LRB) with comments: OPEN REDUCTION INTERNAL FIXATION (ORIF) ANKLE FRACTURE (Right)  Patient Location: PACU  Anesthesia Type:Spinal  Level of Consciousness: awake, alert  and oriented  Airway & Oxygen Therapy: Patient Spontanous Breathing and Patient connected to nasal cannula oxygen  Post-op Assessment: Report given to PACU RN  Post vital signs: Reviewed and stable  Complications: No apparent anesthesia complications

## 2012-04-03 NOTE — Op Note (Signed)
04/03/2012  4:25 PM  PATIENT:  Bobby Donovan  71 y.o. male  PRE-OPERATIVE DIAGNOSIS:  closed bimalleolar right ankle fracture  POST-OPERATIVE DIAGNOSIS:  closed bimalleolar right ankle fracture  Operative findings: The lateral malleolus was severely comminuted with the lateral bone in inverted 180. Medial malleolar fragment displaced   PROCEDURE:  Procedure(s) (LRB) with comments: OPEN REDUCTION INTERNAL FIXATION (ORIF) ANKLE FRACTURE (Right)  Details of procedure The patient was identified in the preop holding area site marking was confirmed as right ankle and marked as such  After chart update the patient was taken to the operating room for spinal anesthetic and administering of weight appropriate dose of IV antibiotics using Ancef.  After timeout was completed the limb was exsanguinated with a 4 inch Esmarch, tourniquet elevated 3 mm mercury. A lateral incision was made over the lateral malleolus creating a full-thickness superior and inferior skin flap. Subperiosteal dissection was performed exposing the fracture completely. The lateral wall of bone was completely inverted 180 at the fracture site. This was reduced and held with a K wire. There were multiple fragments of bone which could not be instrumented with screws. This was bypassed with the plate. 3 distal screws were placed in locking fashion and 4 proximal screws were placed proximal to the fracture. Radiographs confirmed that the ankle mortise was reduced and the fibular fracture was also reduced  We turned our attention to the medial side of the ankle. A straight incision was made over the fracture site extended distally and proximally. The fracture site was opened irrigated the joint was irrigated. The fracture was held with a sharp bone clamp and 2 K wires were placed to hold the fracture. The radiographs were taken to confirm pin position and one of the pins was changed. They were then overdrilled with cannulated drill and  48 mm screws were placed and then the K wires were removed  Final x-rays including 3 views of the ankle showed fracture reduction, mortise reduction and hardware in good position  Both incisions were irrigated with copious amounts of saline  The medial side was closed with 3-0 nylon sutures. The lateral side was closed with 0 Monocryl and staples. A total of 60 cc of Marcaine with epinephrine was injected around the wound edges and ankle joint. After sterile dressings were applied a posterior splint was applied. The patient was then taken to the recovery room in stable condition  Postop plan nonweightbearing until radiographs indicate weightbearing appropriate expect 6 weeks based on the amount of comminution seen. It will probably have to go in a cast.  Stryker lateral plate and  medial 4.0 cannulated screws  one K wire  SURGEON:  Surgeon(s) and Role:    * Vickki Hearing, MD - Primary  PHYSICIAN ASSISTANT:   ASSISTANTS: none   ANESTHESIA:   spinal  EBL:  Total I/O In: 1000 [I.V.:1000] Out: -   BLOOD ADMINISTERED:none  DRAINS: none   LOCAL MEDICATIONS USED:  MARCAINE   , Amount: 60  ml and OTHER epi  SPECIMEN:  No Specimen  DISPOSITION OF SPECIMEN:  N/A  COUNTS:  YES  TOURNIQUET:   Total Tourniquet Time Documented: Thigh (Right) - 72 minutes  DICTATION: .Reubin Milan Dictation  PLAN OF CARE: pacu then home   PATIENT DISPOSITION:  PACU - hemodynamically stable.   Delay start of Pharmacological VTE agent (>24hrs) due to surgical blood loss or risk of bleeding: not applicable

## 2012-04-03 NOTE — Anesthesia Postprocedure Evaluation (Signed)
  Anesthesia Post-op Note  Patient: Bobby Donovan  Procedure(s) Performed: Procedure(s) (LRB) with comments: OPEN REDUCTION INTERNAL FIXATION (ORIF) ANKLE FRACTURE (Right)  Patient Location: PACU  Anesthesia Type:Spinal  Level of Consciousness: awake, alert  and oriented  Airway and Oxygen Therapy: Patient Spontanous Breathing and Patient connected to nasal cannula oxygen  Post-op Pain: none  Post-op Assessment: Post-op Vital signs reviewed, Patient's Cardiovascular Status Stable, Respiratory Function Stable, Patent Airway and No signs of Nausea or vomiting  Post-op Vital Signs: Reviewed and stable  Complications: No apparent anesthesia complications

## 2012-04-03 NOTE — Brief Op Note (Signed)
04/03/2012  4:25 PM  PATIENT:  Lorina Rabon  71 y.o. male  PRE-OPERATIVE DIAGNOSIS:  closed bimalleolar right ankle fracture  POST-OPERATIVE DIAGNOSIS:  closed bimalleolar right ankle fracture  PROCEDURE:  Procedure(s) (LRB) with comments: OPEN REDUCTION INTERNAL FIXATION (ORIF) ANKLE FRACTURE (Right)  SURGEON:  Surgeon(s) and Role:    * Vickki Hearing, MD - Primary  PHYSICIAN ASSISTANT:   ASSISTANTS: none   ANESTHESIA:   spinal  EBL:  Total I/O In: 1000 [I.V.:1000] Out: -   BLOOD ADMINISTERED:none  DRAINS: none   LOCAL MEDICATIONS USED:  MARCAINE   , Amount: 60  ml and OTHER epi  SPECIMEN:  No Specimen  DISPOSITION OF SPECIMEN:  N/A  COUNTS:  YES  TOURNIQUET:   Total Tourniquet Time Documented: Thigh (Right) - 72 minutes  DICTATION: .Reubin Milan Dictation  PLAN OF CARE: pacu then home   PATIENT DISPOSITION:  PACU - hemodynamically stable.   Delay start of Pharmacological VTE agent (>24hrs) due to surgical blood loss or risk of bleeding: not applicable

## 2012-04-07 LAB — GLUCOSE, CAPILLARY: Glucose-Capillary: 118 mg/dL — ABNORMAL HIGH (ref 70–99)

## 2012-04-09 ENCOUNTER — Ambulatory Visit (INDEPENDENT_AMBULATORY_CARE_PROVIDER_SITE_OTHER): Payer: Medicare Other | Admitting: Orthopedic Surgery

## 2012-04-09 ENCOUNTER — Encounter: Payer: Self-pay | Admitting: Orthopedic Surgery

## 2012-04-09 VITALS — Ht 67.0 in | Wt 170.0 lb

## 2012-04-09 DIAGNOSIS — S82843A Displaced bimalleolar fracture of unspecified lower leg, initial encounter for closed fracture: Secondary | ICD-10-CM

## 2012-04-09 MED ORDER — OXYCODONE-ACETAMINOPHEN 5-325 MG PO TABS: 1.0000 | ORAL_TABLET | ORAL | Status: DC | PRN

## 2012-04-09 NOTE — Progress Notes (Signed)
Patient ID: Bobby Donovan, male   DOB: July 28, 1940, 71 y.o.   MRN: [id number] Chief Complaint  Patient presents with  . Follow-up    Post op #1, right ankle.    Status post bimalleolar ankle fixation  Wound check and cast and splint change dressing change  The wounds are clean sanguinous drainage medially. Subcutaneous bruising edema and soft tissue swelling  Recommend more ice and elevation  The splint and dressings applied  Continue nonweightbearing return in one week for x-rays out of plaster and suture and staple removal

## 2012-04-09 NOTE — Patient Instructions (Addendum)
No weight bearing   Elevate the leg

## 2012-04-14 ENCOUNTER — Telehealth: Payer: Self-pay | Admitting: Orthopedic Surgery

## 2012-04-14 NOTE — Telephone Encounter (Signed)
Bobby Donovan wife asked if you will write an order for assistance with Bobby Donovan's daily needs.  She says she cannot bathe and dress him By herself.  I told her we could try to find an agency, but no guarantee. Her # [phone]

## 2012-04-14 NOTE — Telephone Encounter (Signed)
Ask then which agency to call   [facility]   Then send that agency the script

## 2012-04-14 NOTE — Telephone Encounter (Signed)
Faxed order to [facility]/ fax # [phone]

## 2012-04-15 ENCOUNTER — Telehealth: Payer: Self-pay | Admitting: Orthopedic Surgery

## 2012-04-15 NOTE — Telephone Encounter (Signed)
Nicky/[facility] said she called Bobby Donovan about coming out for the ADL's and he said he did not know anything about this and did not need help with his bath. (His wife asked for the help)  He did not want to do anything until after his appointment with you tomorrow.

## 2012-04-16 ENCOUNTER — Encounter: Payer: Self-pay | Admitting: Orthopedic Surgery

## 2012-04-16 ENCOUNTER — Ambulatory Visit (INDEPENDENT_AMBULATORY_CARE_PROVIDER_SITE_OTHER): Payer: Medicare Other | Admitting: Orthopedic Surgery

## 2012-04-16 ENCOUNTER — Ambulatory Visit (INDEPENDENT_AMBULATORY_CARE_PROVIDER_SITE_OTHER): Payer: Medicare Other

## 2012-04-16 VITALS — Ht 67.0 in | Wt 170.0 lb

## 2012-04-16 DIAGNOSIS — S82843A Displaced bimalleolar fracture of unspecified lower leg, initial encounter for closed fracture: Secondary | ICD-10-CM

## 2012-04-16 DIAGNOSIS — S82899A Other fracture of unspecified lower leg, initial encounter for closed fracture: Secondary | ICD-10-CM

## 2012-04-16 MED ORDER — OXYCODONE-ACETAMINOPHEN 5-325 MG PO TABS: 1.0000 | ORAL_TABLET | ORAL | Status: DC | PRN

## 2012-04-16 NOTE — Patient Instructions (Addendum)
Keep  Cast dry   Do not get wet   If it gets wet dry with a hair dryer on low setting and call the office   Walker no weight bearing

## 2012-04-16 NOTE — Progress Notes (Signed)
Patient ID: Bobby Donovan, male   DOB: 01-04-1941, 71 y.o.   MRN: [id number] Chief Complaint  Patient presents with  . Follow-up    1 week recheck on right ankle, staples out and xrays.    1. Ankle fracture, bimalleolar, closed  oxyCODONE-acetaminophen (PERCOCET/ROXICET) 5-325 MG per tablet, DISCONTINUED: oxyCODONE-acetaminophen (PERCOCET/ROXICET) 5-325 MG per tablet  2. Ankle fracture  DG Ankle Complete Right   Status post open treatment internal fixation of the RIGHT ankle with bimalleolar fixation.  Staples and sutures were removed. The wounds are clean. He was placed in a short leg nonweightbearing cast.  His x-ray shows his fracture fixation intact with an intact mortise and anatomic alignment of the fractures.  He will come back in 2 weeks for cast change only

## 2012-04-17 ENCOUNTER — Telehealth: Payer: Self-pay | Admitting: Orthopedic Surgery

## 2012-04-17 NOTE — Telephone Encounter (Signed)
[facility] nurse, Nicki, direct ph# [phone] to follow up on request for ADL's, mainly bathing.   I called to follow up and verify with patient on hold for now.  Per patient's office visit yesterday, patient and wife state that Dr. Romeo Apple advised to hold on regular bath for now, due to cast, and that this would be re-addressed at a later date.   - Relayed this information back to nurse Nicki.

## 2012-04-29 ENCOUNTER — Ambulatory Visit (INDEPENDENT_AMBULATORY_CARE_PROVIDER_SITE_OTHER): Payer: Medicare Other | Admitting: Orthopedic Surgery

## 2012-04-29 ENCOUNTER — Encounter: Payer: Self-pay | Admitting: Orthopedic Surgery

## 2012-04-29 DIAGNOSIS — S82843A Displaced bimalleolar fracture of unspecified lower leg, initial encounter for closed fracture: Secondary | ICD-10-CM

## 2012-04-29 NOTE — Progress Notes (Signed)
Patient ID: Bobby Donovan, male   DOB: [birth year], 71 y.o.   MRN: [id number] Chief Complaint  Patient presents with  . Follow-up    2 week recheck on right ankle fracture and cast change./Surgery date March 30, 2012    1. Ankle fracture, bimalleolar, closed    cast change   Wounds are clean   Apply sh leg cast at 90 degrees

## 2012-04-29 NOTE — Telephone Encounter (Signed)
No note from doctor, patient has been seen since this note entered

## 2012-04-29 NOTE — Patient Instructions (Addendum)
X-rays on December 17  Walker, nonweightbearing

## 2012-05-13 ENCOUNTER — Ambulatory Visit (INDEPENDENT_AMBULATORY_CARE_PROVIDER_SITE_OTHER): Payer: Medicare Other

## 2012-05-13 ENCOUNTER — Ambulatory Visit (INDEPENDENT_AMBULATORY_CARE_PROVIDER_SITE_OTHER): Payer: Medicare Other | Admitting: Orthopedic Surgery

## 2012-05-13 VITALS — BP 110/60 | Ht 67.0 in | Wt 170.0 lb

## 2012-05-13 DIAGNOSIS — S82843A Displaced bimalleolar fracture of unspecified lower leg, initial encounter for closed fracture: Secondary | ICD-10-CM

## 2012-05-13 DIAGNOSIS — S82899A Other fracture of unspecified lower leg, initial encounter for closed fracture: Secondary | ICD-10-CM

## 2012-05-13 NOTE — Patient Instructions (Addendum)
Call primary care doctor for anxiety   Take 50 mg Benadryl for sleep   Walk in brace with Walker full weight bearing

## 2012-05-14 ENCOUNTER — Encounter: Payer: Self-pay | Admitting: Orthopedic Surgery

## 2012-05-14 NOTE — Progress Notes (Signed)
Patient ID: Bobby Donovan, male   DOB: [birth year], 70 y.o.   MRN: [id number] Chief Complaint  Patient presents with  . Follow-up    Recheck right ankle with x ray oop   Date of surgery 03/30/2012  Procedure open treatment internal fixation bimalleolar fracture right ankle  Today's office visit is for the purpose of x-ray out of plaster and possible conversion to a Cam Walker  X-ray show adequate fracture healing to convert to [employer]  Patient placed in long Cam Walker weightbearing as tolerated to follow up in 6 weeks with x-ray  He is concerned about anxiety and inability to sleep  He is advised to take over-the-counter Benadryl and call his family physician to discuss the anxiety

## 2012-06-24 ENCOUNTER — Ambulatory Visit: Payer: Medicare Other | Admitting: Orthopedic Surgery

## 2012-06-29 ENCOUNTER — Ambulatory Visit (INDEPENDENT_AMBULATORY_CARE_PROVIDER_SITE_OTHER): Payer: Medicare Other | Admitting: Orthopedic Surgery

## 2012-06-29 ENCOUNTER — Ambulatory Visit (INDEPENDENT_AMBULATORY_CARE_PROVIDER_SITE_OTHER): Payer: Medicare Other

## 2012-06-29 VITALS — BP 130/68 | Ht 67.0 in | Wt 170.0 lb

## 2012-06-29 DIAGNOSIS — S82899A Other fracture of unspecified lower leg, initial encounter for closed fracture: Secondary | ICD-10-CM

## 2012-06-29 DIAGNOSIS — T8131XA Disruption of external operation (surgical) wound, not elsewhere classified, initial encounter: Secondary | ICD-10-CM

## 2012-06-29 MED ORDER — CEPHALEXIN 500 MG PO CAPS: 500.0000 mg | ORAL_CAPSULE | Freq: Two times a day (BID) | ORAL | Status: DC

## 2012-06-29 NOTE — Progress Notes (Signed)
Patient ID: Bobby Donovan, male   DOB: 1941/01/22, 72 y.o.   MRN: [id number] Chief Complaint  Patient presents with  . Follow-up    6 week recheck on Right ankle fracture with xray. DOS 04-03-12.   BP 130/68  Ht 5\' 7"  (1.702 m)  Wt 170 lb (77.111 kg)  BMI 26.63 kg/m2  1. Postoperative wound breakdown  cephALEXin (KEFLEX) 500 MG capsule, DISCONTINUED: cephALEXin (KEFLEX) 500 MG capsule  2. Ankle fracture  DG Ankle Complete Right, cephALEXin (KEFLEX) 500 MG capsule, DISCONTINUED: cephALEXin (KEFLEX) 500 MG capsule   12 week postop visit status post open treatment internal fixation right ankle 111-08-13  Doing well and his postoperative Cam Walker  He has an area of wound breakdown no metal exposed some surrounding erythema no drainage  Recommend Keflex 500 twice a day x-ray shows stable fixation and healed fracture  Return in 3 weeks for me to check on the wound has 16 days of Keflex

## 2012-06-29 NOTE — Patient Instructions (Addendum)
Remove the brace   Start keflex 500 mg twice a day

## 2012-07-20 ENCOUNTER — Ambulatory Visit (INDEPENDENT_AMBULATORY_CARE_PROVIDER_SITE_OTHER): Payer: Medicare Other | Admitting: Orthopedic Surgery

## 2012-07-20 VITALS — BP 130/60 | Ht 67.0 in | Wt 170.0 lb

## 2012-07-20 DIAGNOSIS — Z5189 Encounter for other specified aftercare: Secondary | ICD-10-CM

## 2012-07-20 DIAGNOSIS — S82841D Displaced bimalleolar fracture of right lower leg, subsequent encounter for closed fracture with routine healing: Secondary | ICD-10-CM

## 2012-07-20 DIAGNOSIS — S8290XD Unspecified fracture of unspecified lower leg, subsequent encounter for closed fracture with routine healing: Secondary | ICD-10-CM

## 2012-07-20 DIAGNOSIS — S82891D Other fracture of right lower leg, subsequent encounter for closed fracture with routine healing: Secondary | ICD-10-CM

## 2012-07-20 DIAGNOSIS — S82899A Other fracture of unspecified lower leg, initial encounter for closed fracture: Secondary | ICD-10-CM | POA: Insufficient documentation

## 2012-07-20 DIAGNOSIS — T8131XA Disruption of external operation (surgical) wound, not elsewhere classified, initial encounter: Secondary | ICD-10-CM | POA: Insufficient documentation

## 2012-07-20 DIAGNOSIS — T8131XD Disruption of external operation (surgical) wound, not elsewhere classified, subsequent encounter: Secondary | ICD-10-CM

## 2012-07-20 MED ORDER — CEPHALEXIN 500 MG PO CAPS: 500.0000 mg | ORAL_CAPSULE | Freq: Two times a day (BID) | ORAL | Status: DC

## 2012-07-20 NOTE — Patient Instructions (Signed)
Neosporin 3 x a day   Continue keflex 500 mg twice a day

## 2012-07-20 NOTE — Progress Notes (Signed)
Patient ID: Bobby Donovan, male   DOB: 1940/10/04, 72 y.o.   MRN: [id number] Chief Complaint  Patient presents with  . Follow-up    right ankle fracture Incision check DOI 04/03/12    BP 130/60  Ht 5\' 7"  (1.702 m)  Wt 170 lb (77.111 kg)  BMI 26.62 kg/m2  Ankle fracture, right, closed, with routine healing, subsequent encounter - Plan: cephALEXin (KEFLEX) 500 MG capsule  Postoperative wound breakdown, subsequent encounter - Plan: cephALEXin (KEFLEX) 500 MG capsule  Ankle fracture, bimalleolar, closed, right, with routine healing, subsequent encounter   The patient is now a little over 3 months from a bimalleolar fracture fixation of his right ankle he is developing wound problem over the distal portion of the incision which is approximately 2 x 2 millimeters with reduced exudate. He says it's actually getting smaller. He was treated with Keflex and felt better on Keflex   Review of systems no fever or chills  Vital signs as recorded. He is awake alert and oriented x3 mood and affect are normal. Ankle motion is improving a 25. Muscle strength is improving at 4-5 out of 5. Ankle remained stable. Distal neurovascular function is intact   Today we see some mild surrounding erythema and tenderness at the 2 x 2 millimeter superficial wound breakdown.  Recommend Neosporin and continue Keflex follow up in 2 weeks. His last x-ray showed excellent position of his hardware no sign of infection

## 2012-08-04 ENCOUNTER — Ambulatory Visit (INDEPENDENT_AMBULATORY_CARE_PROVIDER_SITE_OTHER): Payer: Medicare Other | Admitting: Orthopedic Surgery

## 2012-08-04 VITALS — BP 112/60 | Ht 67.0 in | Wt 170.0 lb

## 2012-08-04 DIAGNOSIS — Z5189 Encounter for other specified aftercare: Secondary | ICD-10-CM

## 2012-08-04 DIAGNOSIS — S8290XD Unspecified fracture of unspecified lower leg, subsequent encounter for closed fracture with routine healing: Secondary | ICD-10-CM

## 2012-08-04 DIAGNOSIS — S82891D Other fracture of right lower leg, subsequent encounter for closed fracture with routine healing: Secondary | ICD-10-CM

## 2012-08-04 MED ORDER — CEPHALEXIN 500 MG PO CAPS: 500.0000 mg | ORAL_CAPSULE | Freq: Two times a day (BID) | ORAL | Status: DC

## 2012-08-04 NOTE — Progress Notes (Signed)
Patient ID: Bobby Donovan, male   DOB: 09/01/1940, 72 y.o.   MRN: [id number] Chief Complaint  Patient presents with  . Follow-up    2 week recheck on wound of right ankle.[04/03/2013]    BP 112/60  Ht 5\' 7"  (1.702 m)  Wt 170 lb (77.111 kg)  BMI 26.62 kg/m2  Current Outpatient Prescriptions on File Prior to Visit  Medication Sig Dispense Refill  . allopurinol (ZYLOPRIM) 300 MG tablet Take 300 mg by mouth daily with supper.      Marland Kitchen amLODipine (NORVASC) 5 MG tablet Take 5 mg by mouth daily with supper.      Marland Kitchen aspirin EC 81 MG tablet Take 81 mg by mouth daily with supper.      . losartan (COZAAR) 100 MG tablet Take 100 mg by mouth daily with supper.      . metFORMIN (GLUCOPHAGE) 500 MG tablet Take 1,000 mg by mouth daily with supper.      . pioglitazone (ACTOS) 30 MG tablet Take 30 mg by mouth daily with supper.      . rosuvastatin (CRESTOR) 20 MG tablet Take 20 mg by mouth daily with supper.      Marland Kitchen HYDROcodone-acetaminophen (NORCO/VICODIN) 5-325 MG per tablet       . oxyCODONE-acetaminophen (PERCOCET/ROXICET) 5-325 MG per tablet Take 1 tablet by mouth every 4 (four) hours as needed for pain.  60 tablet  0  . promethazine (PHENERGAN) 12.5 MG tablet Take 5 tablets (62.5 mg total) by mouth every 4 (four) hours as needed for nausea.  60 tablet  5   No current facility-administered medications on file prior to visit.     The area is now pinpoint no drainage some surrounding erythema  Continue oral and topical antibiotic follow once a month until wound closes continue weightbearing as tolerated

## 2012-08-04 NOTE — Patient Instructions (Addendum)
Continue oral antibiotic and topical cream

## 2012-08-28 ENCOUNTER — Telehealth: Payer: Self-pay | Admitting: Orthopedic Surgery

## 2012-08-28 NOTE — Telephone Encounter (Signed)
You know i dont treat poison ivy   Right ???

## 2012-08-28 NOTE — Telephone Encounter (Signed)
Patient, wife Bobby Donovan, called this morning 9:25am to relay that patient has what looks like a heat rash underneath right foot (same foot that has had the ankle fracture.)  States not hurting, not itching, not warm.  States it "almost looks like poison ivy" .  I've relayed that nurse is not available at this time and that Dr Romeo Apple is in surgery.  Offered appointment for Monday (the regularly scheduled appointment is Thursday, 09/03/12) or for patient to go to Emergency room, Urgent care, or primary care.  Please advise.  Patient ph# [phone].

## 2012-08-31 NOTE — Telephone Encounter (Signed)
Received Dr.Harrison's reply - forwarded to nurse to follow up with patient

## 2012-08-31 NOTE — Telephone Encounter (Signed)
Called and spoke with Belinda,and she states the rash is almost gone and will get Dr. Romeo Apple to look at it when patient comes in on Thursday.

## 2012-09-03 ENCOUNTER — Encounter: Payer: Self-pay | Admitting: Orthopedic Surgery

## 2012-09-03 ENCOUNTER — Ambulatory Visit (INDEPENDENT_AMBULATORY_CARE_PROVIDER_SITE_OTHER): Payer: Medicare Other | Admitting: Orthopedic Surgery

## 2012-09-03 VITALS — BP 102/58 | Ht 67.0 in | Wt 170.0 lb

## 2012-09-03 DIAGNOSIS — S8290XD Unspecified fracture of unspecified lower leg, subsequent encounter for closed fracture with routine healing: Secondary | ICD-10-CM

## 2012-09-03 DIAGNOSIS — S82891D Other fracture of right lower leg, subsequent encounter for closed fracture with routine healing: Secondary | ICD-10-CM

## 2012-09-03 DIAGNOSIS — T8131XD Disruption of external operation (surgical) wound, not elsewhere classified, subsequent encounter: Secondary | ICD-10-CM

## 2012-09-03 DIAGNOSIS — Z5189 Encounter for other specified aftercare: Secondary | ICD-10-CM

## 2012-09-03 MED ORDER — CEPHALEXIN 500 MG PO CAPS: 500.0000 mg | ORAL_CAPSULE | Freq: Two times a day (BID) | ORAL | Status: DC

## 2012-09-03 NOTE — Patient Instructions (Addendum)
Continue Antibiotic Wear compression stockings for swelling Come back in One month for wound check

## 2012-09-03 NOTE — Progress Notes (Signed)
Patient ID: Bobby Donovan, male   DOB: 1940-09-04, 72 y.o.   MRN: [id number] Chief Complaint  Patient presents with  . Follow-up    wound check right ankle/ DOS NOV 2013 OTIF BIMALL ANKLE     WOUND CHECK   X 2 MM WOUND POSTERIOR TO THE INCISION FROM THE NOTED SURGERY   IT CLOSING   HE IS ON KEFLEX  EDEMA IMPROVED AND AROM IMPROVED   CONTINUE ANTIBIOTIC AND RETURN 1 MONTH   ADD COMPRESSION HOSE

## 2012-10-06 ENCOUNTER — Encounter: Payer: Self-pay | Admitting: Orthopedic Surgery

## 2012-10-06 ENCOUNTER — Ambulatory Visit (INDEPENDENT_AMBULATORY_CARE_PROVIDER_SITE_OTHER): Payer: Medicare Other | Admitting: Orthopedic Surgery

## 2012-10-06 VITALS — BP 130/62 | Ht 67.0 in | Wt 170.0 lb

## 2012-10-06 DIAGNOSIS — S8290XD Unspecified fracture of unspecified lower leg, subsequent encounter for closed fracture with routine healing: Secondary | ICD-10-CM

## 2012-10-06 DIAGNOSIS — S82891D Other fracture of right lower leg, subsequent encounter for closed fracture with routine healing: Secondary | ICD-10-CM

## 2012-10-06 DIAGNOSIS — T8131XD Disruption of external operation (surgical) wound, not elsewhere classified, subsequent encounter: Secondary | ICD-10-CM

## 2012-10-06 DIAGNOSIS — Z5189 Encounter for other specified aftercare: Secondary | ICD-10-CM

## 2012-10-06 MED ORDER — CEPHALEXIN 500 MG PO CAPS: 500.0000 mg | ORAL_CAPSULE | Freq: Two times a day (BID) | ORAL | Status: DC

## 2012-10-06 NOTE — Progress Notes (Signed)
Patient ID: Bobby Donovan, male   DOB: 1940/11/07, 72 y.o.   MRN: [id number] Chief Complaint  Patient presents with  . Follow-up    1 month recheck on wound right ankle, DOS 11/13.    BP 130/62  Ht 5\' 7"  (1.702 m)  Wt 170 lb (77.111 kg)  BMI 26.62 kg/m2  The wound has now become pinpoint he is on Keflex we will continue see him in a month he is returning to work 3 days a week as his part-time job

## 2012-10-06 NOTE — Patient Instructions (Addendum)
Continue antibiotics

## 2012-11-03 ENCOUNTER — Ambulatory Visit (INDEPENDENT_AMBULATORY_CARE_PROVIDER_SITE_OTHER): Payer: Medicare Other | Admitting: Orthopedic Surgery

## 2012-11-03 ENCOUNTER — Encounter: Payer: Self-pay | Admitting: Orthopedic Surgery

## 2012-11-03 VITALS — BP 122/58 | Ht 67.0 in | Wt 170.0 lb

## 2012-11-03 DIAGNOSIS — T889XXS Complication of surgical and medical care, unspecified, sequela: Secondary | ICD-10-CM

## 2012-11-03 DIAGNOSIS — T8131XS Disruption of external operation (surgical) wound, not elsewhere classified, sequela: Secondary | ICD-10-CM

## 2012-11-03 DIAGNOSIS — S82841D Displaced bimalleolar fracture of right lower leg, subsequent encounter for closed fracture with routine healing: Secondary | ICD-10-CM

## 2012-11-03 DIAGNOSIS — IMO0001 Reserved for inherently not codable concepts without codable children: Secondary | ICD-10-CM

## 2012-11-03 NOTE — Patient Instructions (Addendum)
Antibiotics continue  Donut over the wound

## 2012-11-03 NOTE — Progress Notes (Signed)
Subjective:     Patient ID: ARGYLE GUSTAFSON, male   DOB: 04-21-1941, 72 y.o.   MRN: [id number] Chief Complaint  Patient presents with  . Follow-up    1 month recheck on wound right ankle, DOS 11/13.     HPI S/p ankle fracture with wound  Treating with antibiotics keflex  Review of Systems Normal     Objective:   Physical Exam 3 mm x 3 mm wound on lateral ankle superficial  No tenderness no erythema     Assessment:     Pressure sore over lateral malleolus      Plan:      continued antibiotic and use pressure relief doughnut return 1 month

## 2012-11-18 ENCOUNTER — Telehealth: Payer: Self-pay | Admitting: Orthopedic Surgery

## 2012-11-18 NOTE — Telephone Encounter (Signed)
Ok we will refer

## 2012-11-18 NOTE — Telephone Encounter (Signed)
Routing to Dr Harrison 

## 2012-11-18 NOTE — Telephone Encounter (Signed)
Left message for wound center in [facility] to make referral appointment.

## 2012-11-18 NOTE — Telephone Encounter (Signed)
Patient's wife called, states patient asked her to call for him regarding wound, right ankle, surgery 04/03/12.  She said it does not seem to be healing, noted that patient is diabetic.  Offered appointment -- patient's wife relates she would like to know if Dr. Romeo Apple can refer patient to a wound center that takes care of diabetic wound care, and mentioned that he would prefer a [facility] location.  Please advise.  Patient Ph#'s  are : [phone] [facility])     [phone] [facility])

## 2012-11-25 ENCOUNTER — Other Ambulatory Visit: Payer: Self-pay | Admitting: *Deleted

## 2012-11-25 DIAGNOSIS — T8131XD Disruption of external operation (surgical) wound, not elsewhere classified, subsequent encounter: Secondary | ICD-10-CM

## 2012-11-25 NOTE — Telephone Encounter (Signed)
Faxed referral and office notes to [facility] wound Center. Appointment 12/14/12 at 12:45pm

## 2012-12-01 ENCOUNTER — Ambulatory Visit (INDEPENDENT_AMBULATORY_CARE_PROVIDER_SITE_OTHER): Payer: Medicare Other | Admitting: Orthopedic Surgery

## 2012-12-01 ENCOUNTER — Encounter: Payer: Self-pay | Admitting: Orthopedic Surgery

## 2012-12-01 VITALS — BP 126/70 | Ht 67.0 in | Wt 170.0 lb

## 2012-12-01 DIAGNOSIS — S82841D Displaced bimalleolar fracture of right lower leg, subsequent encounter for closed fracture with routine healing: Secondary | ICD-10-CM

## 2012-12-01 DIAGNOSIS — T8131XS Disruption of external operation (surgical) wound, not elsewhere classified, sequela: Secondary | ICD-10-CM

## 2012-12-01 DIAGNOSIS — IMO0001 Reserved for inherently not codable concepts without codable children: Secondary | ICD-10-CM

## 2012-12-01 DIAGNOSIS — T889XXS Complication of surgical and medical care, unspecified, sequela: Secondary | ICD-10-CM

## 2012-12-01 NOTE — Patient Instructions (Signed)
Wound Center appointment

## 2012-12-01 NOTE — Progress Notes (Signed)
Patient ID: Bobby Donovan, male   DOB: 04/30/1941, 72 y.o.   MRN: [id number] Chief Complaint  Patient presents with  . Follow-up    1 month recheck on wound right ankle, DOS 11/13.    The patient follows after his right ankle surgery back in November he had a persistent wound over the right ankle has been on oral antibiotics and it just seems to not want to completely heal. He is considering an appointment at the wound Center which we support wholeheartedly. He is very happy with the result of his ankle fracture in his fixation. He's been able to return to almost normal activities. He's not having any major pain.  Review of systems is negative  His opinion point area of chronic scarring over the lateral ankle which has a fibrinous exudate no drainage some surrounding erythema remaining wound medially and laterally is intact his ankle dorsiflexion is 8 plantar flexion is 15.  Ankle fracture status post internal fixation Chronic wound right ankle  Followup 3 months  Patient advised to keep [place] abreast of recommendations from the wound Center in case they want the hardware removed

## 2012-12-21 ENCOUNTER — Encounter (HOSPITAL_BASED_OUTPATIENT_CLINIC_OR_DEPARTMENT_OTHER): Payer: Medicare Other | Attending: General Surgery

## 2012-12-21 DIAGNOSIS — Y838 Other surgical procedures as the cause of abnormal reaction of the patient, or of later complication, without mention of misadventure at the time of the procedure: Secondary | ICD-10-CM | POA: Insufficient documentation

## 2012-12-21 DIAGNOSIS — T8189XA Other complications of procedures, not elsewhere classified, initial encounter: Secondary | ICD-10-CM | POA: Insufficient documentation

## 2013-01-04 ENCOUNTER — Telehealth: Payer: Self-pay | Admitting: Orthopedic Surgery

## 2013-01-04 NOTE — Telephone Encounter (Signed)
Bobby Donovan has an appointment to see you tomorrow (01/05/13) morning.  He has been to the wound center and said they discharged him and told him to use Bag Balm.  He said that since last Thursday he has had ankle pain when walking and from the center of his foot to the outside of the right ankle it has a bluish, purple color. Says it is a little warm.  He said he is OK to wait until tomorrow if OK with you.  Please advise.  His # [phone]

## 2013-01-04 NOTE — Telephone Encounter (Signed)
Ok

## 2013-01-04 NOTE — Telephone Encounter (Signed)
Advised the patient of doctor's reply °

## 2013-01-05 ENCOUNTER — Encounter: Payer: Self-pay | Admitting: Orthopedic Surgery

## 2013-01-05 ENCOUNTER — Ambulatory Visit (INDEPENDENT_AMBULATORY_CARE_PROVIDER_SITE_OTHER): Payer: Medicare Other | Admitting: Orthopedic Surgery

## 2013-01-05 ENCOUNTER — Ambulatory Visit (HOSPITAL_COMMUNITY)
Admission: RE | Admit: 2013-01-05 | Discharge: 2013-01-05 | Disposition: A | Discharge status: Home / Self Care | Payer: Medicare Other | Source: Ambulatory Visit | Attending: Orthopedic Surgery | Admitting: Orthopedic Surgery

## 2013-01-05 VITALS — BP 130/72 | Ht 67.0 in | Wt 170.0 lb

## 2013-01-05 DIAGNOSIS — M25579 Pain in unspecified ankle and joints of unspecified foot: Secondary | ICD-10-CM

## 2013-01-05 DIAGNOSIS — M7989 Other specified soft tissue disorders: Secondary | ICD-10-CM | POA: Insufficient documentation

## 2013-01-05 DIAGNOSIS — L03119 Cellulitis of unspecified part of limb: Secondary | ICD-10-CM

## 2013-01-05 DIAGNOSIS — M25571 Pain in right ankle and joints of right foot: Secondary | ICD-10-CM | POA: Insufficient documentation

## 2013-01-05 DIAGNOSIS — L02419 Cutaneous abscess of limb, unspecified: Secondary | ICD-10-CM

## 2013-01-05 DIAGNOSIS — X58XXXA Exposure to other specified factors, initial encounter: Secondary | ICD-10-CM | POA: Insufficient documentation

## 2013-01-05 DIAGNOSIS — S8253XA Displaced fracture of medial malleolus of unspecified tibia, initial encounter for closed fracture: Secondary | ICD-10-CM | POA: Insufficient documentation

## 2013-01-05 MED ORDER — SULFAMETHOXAZOLE-TMP DS 800-160 MG PO TABS: 1.0000 | ORAL_TABLET | Freq: Two times a day (BID) | ORAL | Status: DC

## 2013-01-05 NOTE — Progress Notes (Signed)
Patient ID: Bobby Donovan, male   DOB: 01/13/1941, 72 y.o.   MRN: [id number] Chief Complaint  Patient presents with  . Follow-up    Recheck right ankle wound s/p wound center referral    This patient is status post open treatment internal fixation of the right ankle constipated by long-standing wound issues and was eventually referred to the wound care center for small nonhealing wound at the distal malleolus with no evidence of hardware exposure. He placed some type of cream on it and it healed up approximately 3-4 days later he started having pain and swelling which eventually progressed to redness and swelling over the right ankle incision progressed across the dorsum of the leg above the ankle joint.  Review of systems no weight loss weight gain fever chills fatigue skin changes as stated denies numbness or tingling did have some gait abnormality secondary to pain  Exam reveals the following BP 130/72  Ht 5\' 7"  (1.702 m)  Wt 170 lb (77.111 kg)  BMI 26.62 kg/m2 General appearance is normal, the patient is alert and oriented x3 with normal mood and affect. He is ambulating with a slight limp has tenderness and redness over the incision and across the dorsum of the distal leg he has painless range of motion in the ankle with stability tests normal muscle tone normal no atrophy skin wound healed incision healed redness and tenderness over the incision no drainage normal partials pulse and normal sensation without reflex abnormality  X-rays will be taken and reviewed  X-rays show the following Normal hardware without fracture or osteomyelitis. On the medial side we do see cyst formation around the fracture site but this is chronic in noncontributory

## 2013-01-05 NOTE — Patient Instructions (Addendum)
Diagnosis cellulitis right ankle  X-rays show no change in position of the hardware in the ankle fracture has healed nicely  Start Bactrim one twice a day, we will continue that for one month  I will recheck the leg in 2 weeks

## 2013-01-19 ENCOUNTER — Encounter: Payer: Self-pay | Admitting: Orthopedic Surgery

## 2013-01-19 ENCOUNTER — Ambulatory Visit (INDEPENDENT_AMBULATORY_CARE_PROVIDER_SITE_OTHER): Payer: Medicare Other | Admitting: Orthopedic Surgery

## 2013-01-19 VITALS — BP 126/62 | Ht 67.0 in | Wt 170.0 lb

## 2013-01-19 DIAGNOSIS — L03119 Cellulitis of unspecified part of limb: Secondary | ICD-10-CM

## 2013-01-19 DIAGNOSIS — L02419 Cutaneous abscess of limb, unspecified: Secondary | ICD-10-CM

## 2013-01-19 NOTE — Progress Notes (Signed)
Patient ID: Bobby Donovan, male   DOB: 07-Apr-1941, 72 y.o.   MRN: [id number]  Chief Complaint  Patient presents with  . Follow-up    2 week recheck right ankle DOS 04/03/2012    Chief Complaint   Patient presents with   .  Follow-up       Recheck right ankle wound s/p wound center referral     This patient is status post open treatment internal fixation of the right ankle constipated by long-standing wound issues and was eventually referred to the wound care center for small nonhealing wound at the distal malleolus with no evidence of hardware exposure. He placed some type of cream on it and it healed up approximately 3-4 days later he started having pain and swelling which eventually progressed to redness and swelling over the right ankle incision progressed across the dorsum of the leg above the ankle joint. Chief Complaint   Patient presents with   .  Follow-up       Recheck right ankle wound s/p wound center referral     X-rays last visit Normal hardware without fracture or osteomyelitis. On the medial side we do see cyst formation around the fracture site but this is chronic in noncontributory  Him on Bactrim he did well with that in terms of pain swelling but he did have some trouble with starting his urine. When I try to give 1 more week out of the antibiotic since his ankle feels and looks so good right now. He has no pain tenderness or swelling at this point the warmth is gone the redness is almost gone his range of motion is improved he has no drainage at his wound is ambulating unassisted BP 126/62  Ht 5\' 7"  (1.702 m)  Wt 170 lb (77.111 kg)  BMI 26.62 kg/m2 General appearance is normal, the patient is alert and oriented x3 with normal mood and affect.   Cellulitis after ankle surgery  Continue antibiotic 1 week then stop. See me in 2 weeks to see him off antibiotics

## 2013-01-19 NOTE — Patient Instructions (Signed)
Take med 1 more week  See me in 2 weeks

## 2013-02-02 ENCOUNTER — Ambulatory Visit (INDEPENDENT_AMBULATORY_CARE_PROVIDER_SITE_OTHER): Payer: Medicare Other | Admitting: Orthopedic Surgery

## 2013-02-02 ENCOUNTER — Encounter: Payer: Self-pay | Admitting: Orthopedic Surgery

## 2013-02-02 DIAGNOSIS — S82841D Displaced bimalleolar fracture of right lower leg, subsequent encounter for closed fracture with routine healing: Secondary | ICD-10-CM

## 2013-02-02 DIAGNOSIS — L03119 Cellulitis of unspecified part of limb: Secondary | ICD-10-CM

## 2013-02-02 DIAGNOSIS — IMO0001 Reserved for inherently not codable concepts without codable children: Secondary | ICD-10-CM

## 2013-02-02 DIAGNOSIS — L02419 Cutaneous abscess of limb, unspecified: Secondary | ICD-10-CM

## 2013-02-02 NOTE — Patient Instructions (Signed)
Stop antibiotics and return to normal activity

## 2013-02-02 NOTE — Progress Notes (Signed)
Patient ID: Bobby Donovan, male   DOB: [birth year], 72 y.o.   MRN: [id number]  Chief complaint followup right ankle  I'm not hurting anymore  The patient was on Bactrim for cellulitis  His been off of it 1 week no pain walking fairly well  Exam shows no redness or tenderness, mild swelling medial and lateral  Incision healed previous draining wound closed  Followup in 3 months stop all antibiotics

## 2013-05-04 ENCOUNTER — Ambulatory Visit (INDEPENDENT_AMBULATORY_CARE_PROVIDER_SITE_OTHER): Payer: Medicare Other | Admitting: Orthopedic Surgery

## 2013-05-04 VITALS — Ht 67.0 in | Wt 170.0 lb

## 2013-05-04 DIAGNOSIS — IMO0001 Reserved for inherently not codable concepts without codable children: Secondary | ICD-10-CM

## 2013-05-04 DIAGNOSIS — L03119 Cellulitis of unspecified part of limb: Secondary | ICD-10-CM

## 2013-05-04 DIAGNOSIS — L02419 Cutaneous abscess of limb, unspecified: Secondary | ICD-10-CM

## 2013-05-04 DIAGNOSIS — S82841D Displaced bimalleolar fracture of right lower leg, subsequent encounter for closed fracture with routine healing: Secondary | ICD-10-CM

## 2013-05-04 NOTE — Progress Notes (Signed)
Patient ID: Bobby Donovan, male   DOB: 13-Jun-1940, 72 y.o.   MRN: [id number]  Chief Complaint  Patient presents with  . Follow-up    3 month recheck on right ankle. DOS 11/13.    Ht 5\' 7"  (1.702 m)  Wt 170 lb (77.111 kg)  BMI 26.62 kg/m2  Status post ankle fixation a year ago developed some postoperative wound complications treated with oral antibiotics and eventually treated at the wound care center here for a routine followup. He complains of no pain he is ambulating normally he is having no drainage  There is no drainage at the wound site no redness tenderness or swelling  Recommend normal activities followup as needed

## 2013-05-04 NOTE — Patient Instructions (Signed)
activities as tolerated 

## 2015-03-30 IMAGING — CR DG ANKLE COMPLETE 3+V*R*
3 series · 3 of 3 positions shown · non-contrast
Comparison: 06/29/2012

CLINICAL DATA: Lateral right ankle pain.  Prior fracture.

RIGHT ANKLE - COMPLETE 3+ VIEW

[view not recorded (1 of 3)]
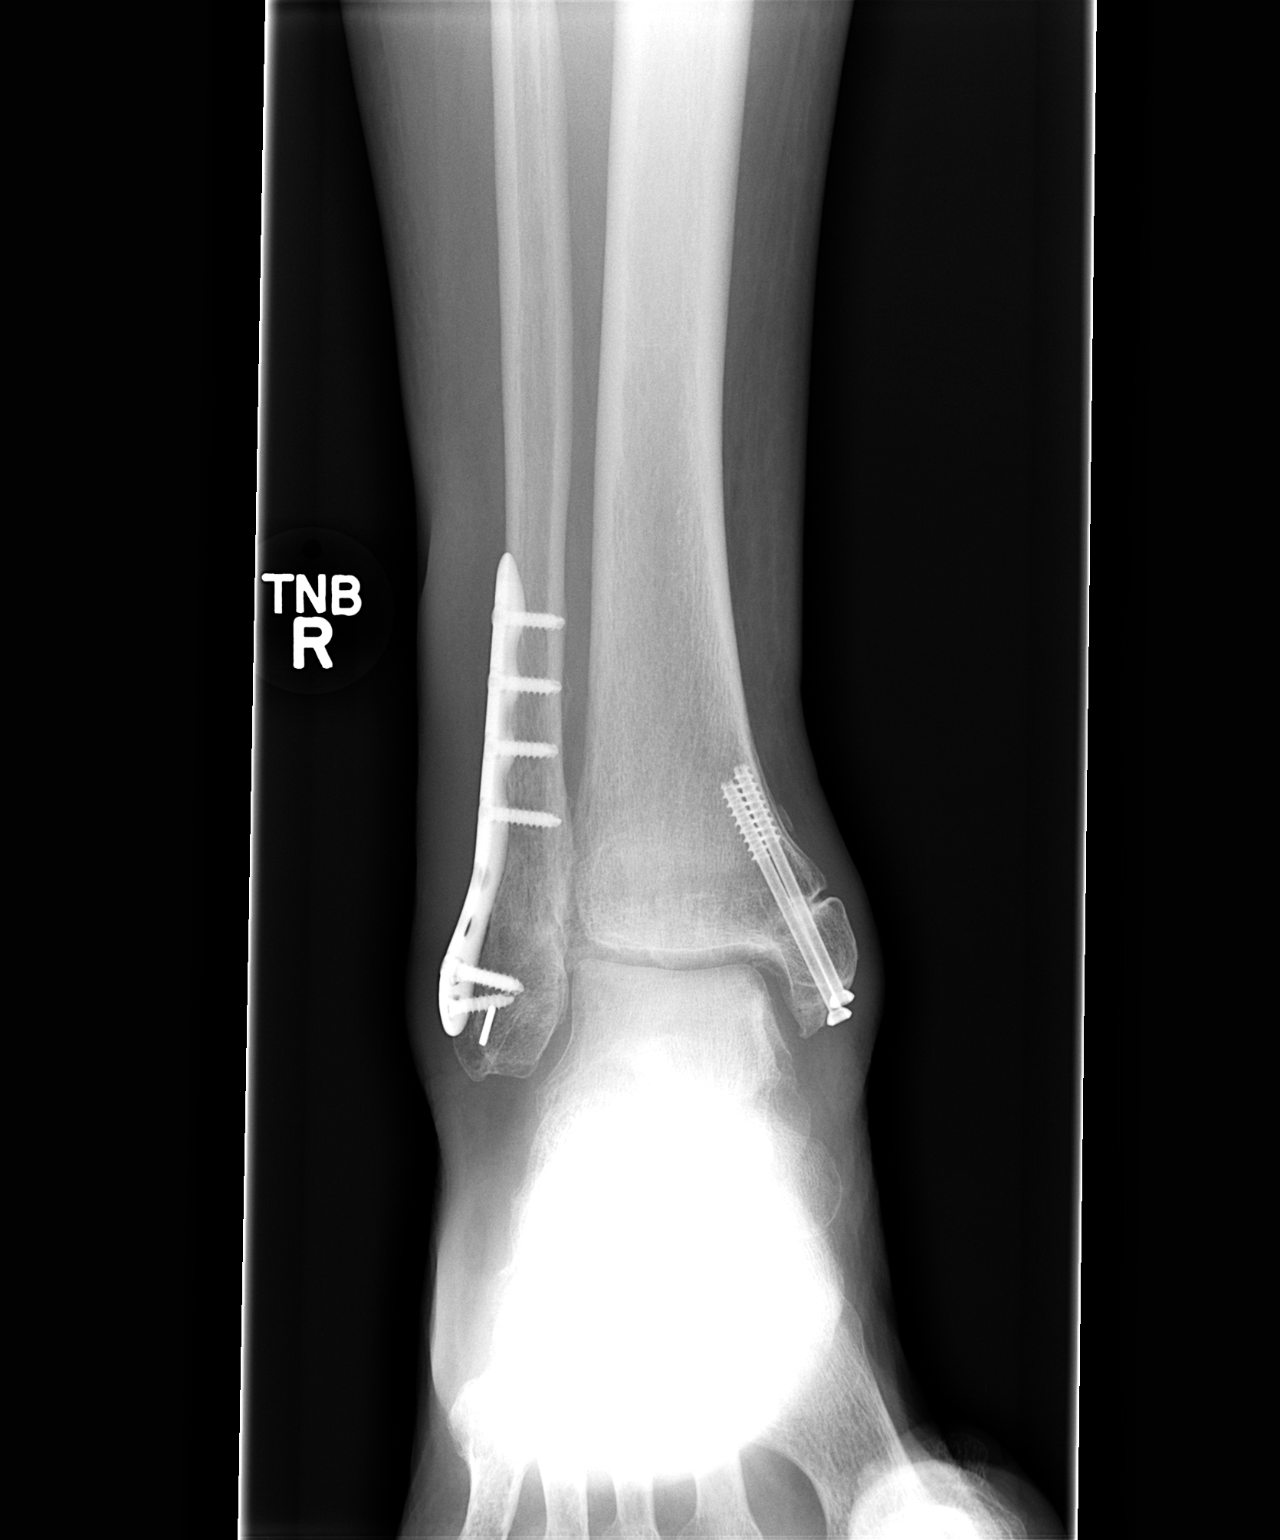

[view not recorded (2 of 3)]
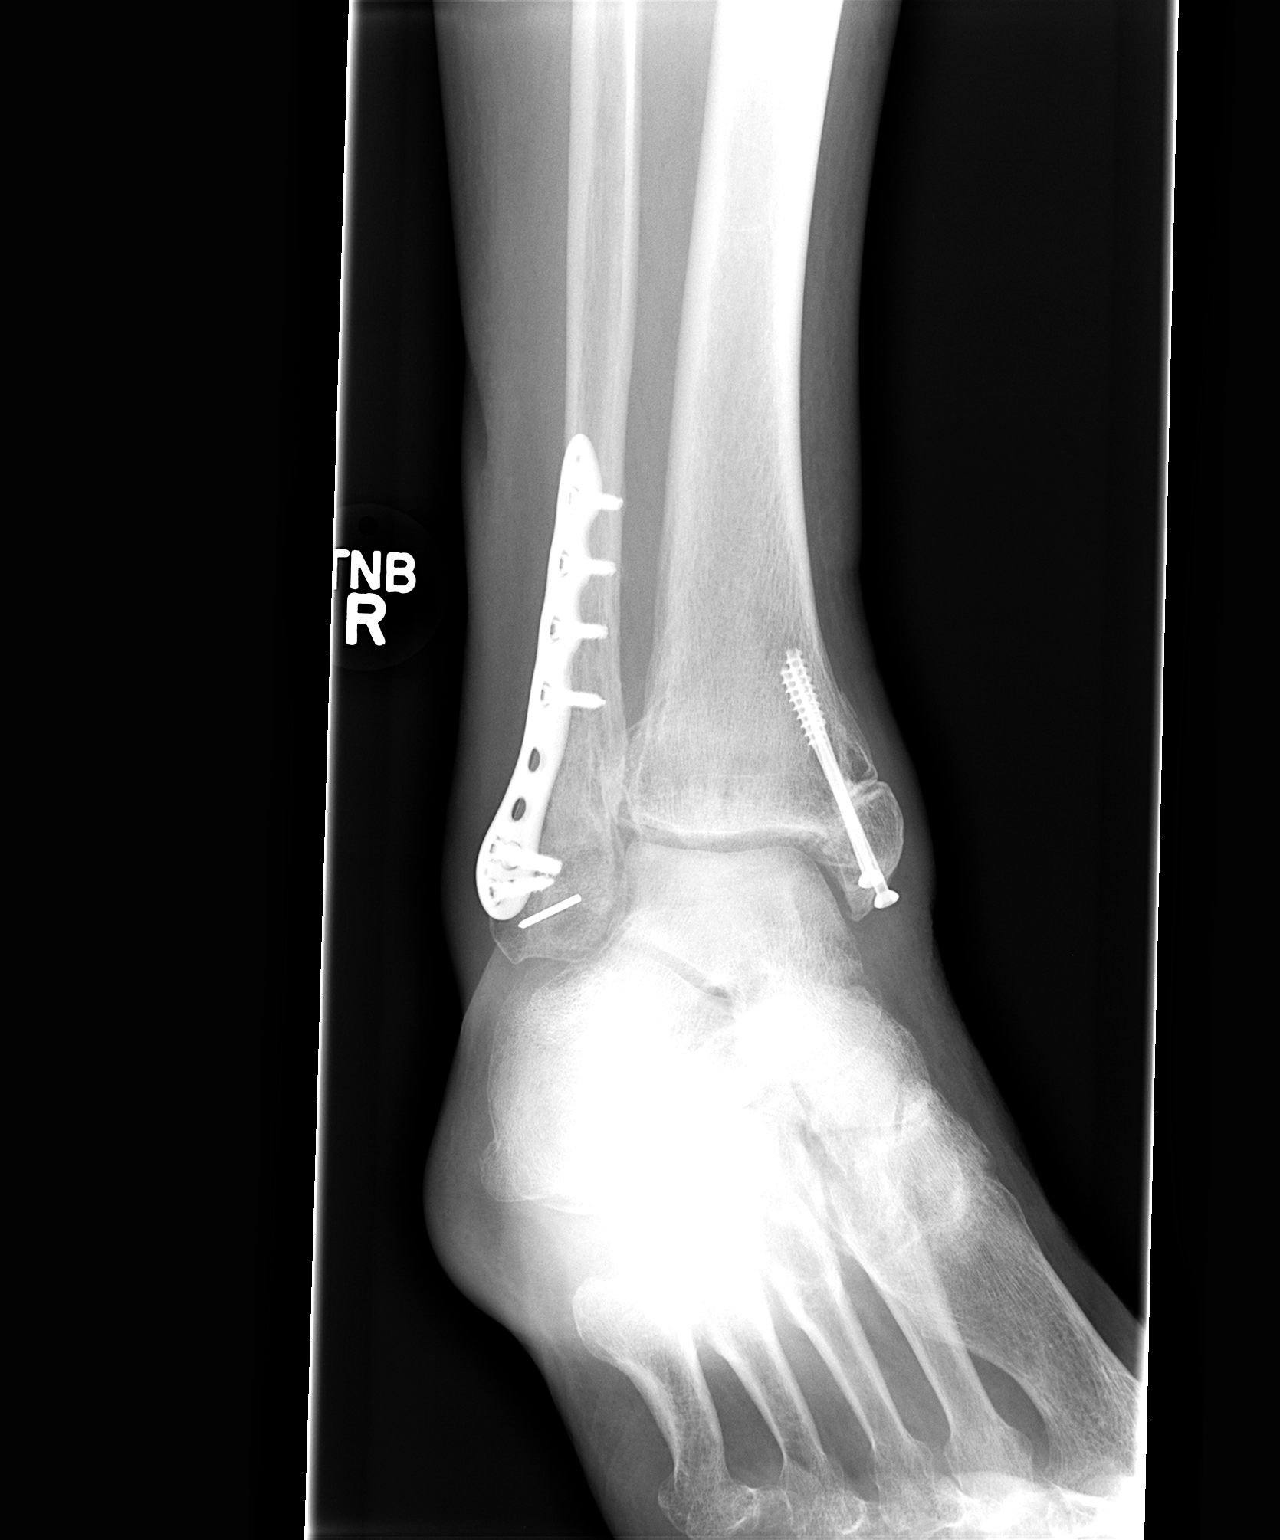

[view not recorded (3 of 3)]
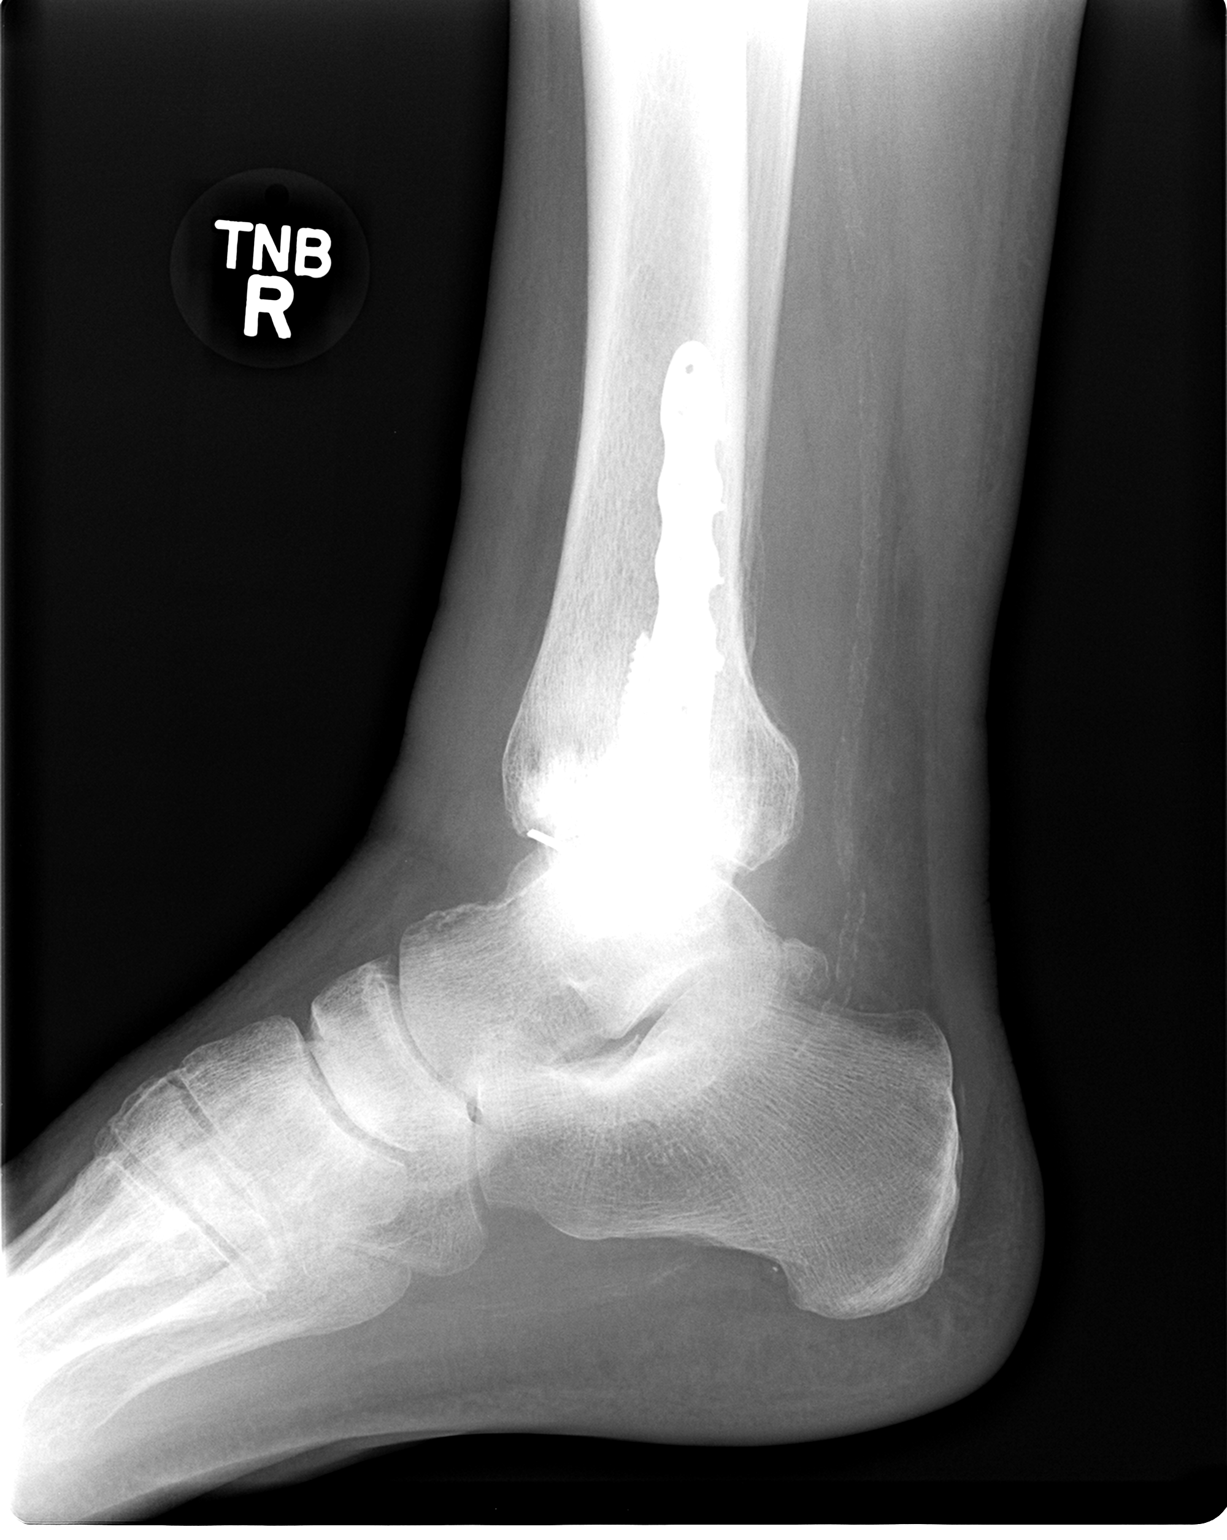

[3 of 3 positions shown; findings below may reference images not displayed]

FINDINGS: Fixation of the medial malleolar fracture with two
cannulated screws noted.  Suspected partial nonunion if not
complete nonunion.

Plate and screw fixator of the distal fibula noted along with a
fragment of a K-wire. There is soft tissue swelling proximal to and
along the lateral malleolus.

Atherosclerotic calcification is present.
IMPRESSION: 1.  Cannulated screw fixation of the medial malleolus, with at
least partial nonunion.
2.  Atherosclerosis.
3.  Lateral soft tissue swelling.

## 2017-09-02 ENCOUNTER — Other Ambulatory Visit: Payer: Self-pay | Admitting: Nurse Practitioner

## 2017-09-02 DIAGNOSIS — R0989 Other specified symptoms and signs involving the circulatory and respiratory systems: Secondary | ICD-10-CM

## 2017-09-08 ENCOUNTER — Ambulatory Visit (HOSPITAL_COMMUNITY)
Admission: RE | Admit: 2017-09-08 | Discharge: 2017-09-08 | Disposition: A | Discharge status: Home / Self Care | Payer: Medicare Other | Source: Ambulatory Visit | Attending: Nurse Practitioner | Admitting: Nurse Practitioner

## 2017-09-08 DIAGNOSIS — R0989 Other specified symptoms and signs involving the circulatory and respiratory systems: Secondary | ICD-10-CM | POA: Diagnosis present

## 2017-09-08 DIAGNOSIS — I6523 Occlusion and stenosis of bilateral carotid arteries: Secondary | ICD-10-CM | POA: Insufficient documentation

## 2018-01-12 ENCOUNTER — Other Ambulatory Visit: Payer: Self-pay

## 2018-01-12 ENCOUNTER — Emergency Department (HOSPITAL_COMMUNITY): Payer: Medicare Other

## 2018-01-12 ENCOUNTER — Observation Stay (HOSPITAL_COMMUNITY): Payer: Medicare Other

## 2018-01-12 ENCOUNTER — Inpatient Hospital Stay (HOSPITAL_COMMUNITY)
Admission: EM | Admit: 2018-01-12 | Discharge: 2018-01-14 | DRG: 684 | Disposition: A | Discharge status: Home / Self Care | Payer: Medicare Other | Attending: Internal Medicine | Admitting: Internal Medicine

## 2018-01-12 ENCOUNTER — Encounter (HOSPITAL_COMMUNITY): Payer: Self-pay | Admitting: Emergency Medicine

## 2018-01-12 DIAGNOSIS — I10 Essential (primary) hypertension: Secondary | ICD-10-CM

## 2018-01-12 DIAGNOSIS — J069 Acute upper respiratory infection, unspecified: Secondary | ICD-10-CM | POA: Diagnosis present

## 2018-01-12 DIAGNOSIS — E1122 Type 2 diabetes mellitus with diabetic chronic kidney disease: Secondary | ICD-10-CM | POA: Diagnosis present

## 2018-01-12 DIAGNOSIS — J209 Acute bronchitis, unspecified: Secondary | ICD-10-CM | POA: Insufficient documentation

## 2018-01-12 DIAGNOSIS — R05 Cough: Secondary | ICD-10-CM

## 2018-01-12 DIAGNOSIS — E869 Volume depletion, unspecified: Secondary | ICD-10-CM | POA: Diagnosis present

## 2018-01-12 DIAGNOSIS — R531 Weakness: Secondary | ICD-10-CM

## 2018-01-12 DIAGNOSIS — N179 Acute kidney failure, unspecified: Secondary | ICD-10-CM | POA: Diagnosis not present

## 2018-01-12 DIAGNOSIS — Z7982 Long term (current) use of aspirin: Secondary | ICD-10-CM

## 2018-01-12 DIAGNOSIS — R059 Cough, unspecified: Secondary | ICD-10-CM

## 2018-01-12 DIAGNOSIS — N183 Chronic kidney disease, stage 3 unspecified: Secondary | ICD-10-CM

## 2018-01-12 DIAGNOSIS — I447 Left bundle-branch block, unspecified: Secondary | ICD-10-CM | POA: Diagnosis present

## 2018-01-12 DIAGNOSIS — I129 Hypertensive chronic kidney disease with stage 1 through stage 4 chronic kidney disease, or unspecified chronic kidney disease: Secondary | ICD-10-CM | POA: Diagnosis present

## 2018-01-12 DIAGNOSIS — M109 Gout, unspecified: Secondary | ICD-10-CM | POA: Diagnosis present

## 2018-01-12 DIAGNOSIS — N189 Chronic kidney disease, unspecified: Secondary | ICD-10-CM

## 2018-01-12 DIAGNOSIS — E785 Hyperlipidemia, unspecified: Secondary | ICD-10-CM | POA: Diagnosis present

## 2018-01-12 LAB — CBC WITH DIFFERENTIAL/PLATELET
Basophils Absolute: 0 10*3/uL (ref 0.0–0.1)
Basophils Relative: 0 %
EOS PCT: 1 %
Eosinophils Absolute: 0.1 10*3/uL (ref 0.0–0.7)
HCT: 31 % — ABNORMAL LOW (ref 39.0–52.0)
HEMOGLOBIN: 9.8 g/dL — AB (ref 13.0–17.0)
LYMPHS ABS: 0.6 10*3/uL — AB (ref 0.7–4.0)
LYMPHS PCT: 5 %
MCH: 30.3 pg (ref 26.0–34.0)
MCHC: 31.6 g/dL (ref 30.0–36.0)
MCV: 96 fL (ref 78.0–100.0)
Monocytes Absolute: 1.4 10*3/uL — ABNORMAL HIGH (ref 0.1–1.0)
Monocytes Relative: 12 %
NEUTROS PCT: 82 %
Neutro Abs: 9.8 10*3/uL — ABNORMAL HIGH (ref 1.7–7.7)
Platelets: 169 10*3/uL (ref 150–400)
RBC: 3.23 MIL/uL — AB (ref 4.22–5.81)
RDW: 12.8 % (ref 11.5–15.5)
WBC: 11.9 10*3/uL — AB (ref 4.0–10.5)

## 2018-01-12 LAB — CBG MONITORING, ED: Glucose-Capillary: 150 mg/dL — ABNORMAL HIGH (ref 70–99)

## 2018-01-12 LAB — GLUCOSE, CAPILLARY
Glucose-Capillary: 148 mg/dL — ABNORMAL HIGH (ref 70–99)
Glucose-Capillary: 168 mg/dL — ABNORMAL HIGH (ref 70–99)

## 2018-01-12 LAB — URINALYSIS, COMPLETE (UACMP) WITH MICROSCOPIC
Bilirubin Urine: NEGATIVE
GLUCOSE, UA: NEGATIVE mg/dL
HGB URINE DIPSTICK: NEGATIVE
Ketones, ur: NEGATIVE mg/dL
Leukocytes, UA: NEGATIVE
NITRITE: NEGATIVE
PROTEIN: NEGATIVE mg/dL
Specific Gravity, Urine: 1.012 (ref 1.005–1.030)
pH: 5 (ref 5.0–8.0)

## 2018-01-12 LAB — CK: Total CK: 59 U/L (ref 49–397)

## 2018-01-12 LAB — BASIC METABOLIC PANEL
Anion gap: 12 (ref 5–15)
BUN: 58 mg/dL — AB (ref 8–23)
CO2: 22 mmol/L (ref 22–32)
Calcium: 8.6 mg/dL — ABNORMAL LOW (ref 8.9–10.3)
Chloride: 103 mmol/L (ref 98–111)
Creatinine, Ser: 2.73 mg/dL — ABNORMAL HIGH (ref 0.61–1.24)
GFR calc Af Amer: 25 mL/min — ABNORMAL LOW (ref >60)
GFR calc non Af Amer: 21 mL/min — ABNORMAL LOW (ref >60)
GLUCOSE: 181 mg/dL — AB (ref 70–99)
POTASSIUM: 4.4 mmol/L (ref 3.5–5.1)
Sodium: 137 mmol/L (ref 135–145)

## 2018-01-12 LAB — FOLATE: FOLATE: 12 ng/mL (ref >5.9)

## 2018-01-12 LAB — TSH: TSH: 0.299 u[IU]/mL — ABNORMAL LOW (ref 0.350–4.500)

## 2018-01-12 LAB — HEMOGLOBIN A1C
Hgb A1c MFr Bld: 7.3 % — ABNORMAL HIGH (ref 4.8–5.6)
MEAN PLASMA GLUCOSE: 162.81 mg/dL

## 2018-01-12 LAB — VITAMIN B12: Vitamin B-12: 2105 pg/mL — ABNORMAL HIGH (ref 180–914)

## 2018-01-12 LAB — T4, FREE: Free T4: 1.12 ng/dL (ref 0.82–1.77)

## 2018-01-12 MED ORDER — FINASTERIDE 5 MG PO TABS
5.0000 mg | ORAL_TABLET | Freq: Every day | ORAL | Status: DC
  Administered 2018-01-13: 5 mg via ORAL
  Filled 2018-01-12 (×7): qty 1

## 2018-01-12 MED ORDER — ASPIRIN EC 81 MG PO TBEC
81.0000 mg | DELAYED_RELEASE_TABLET | Freq: Every day | ORAL | Status: DC
  Administered 2018-01-12 – 2018-01-13 (×2): 81 mg via ORAL
  Filled 2018-01-12 (×2): qty 1

## 2018-01-12 MED ORDER — SODIUM CHLORIDE 0.9 % IV BOLUS
1000.0000 mL | Freq: Once | INTRAVENOUS | Status: AC
  Administered 2018-01-12: 1000 mL via INTRAVENOUS

## 2018-01-12 MED ORDER — TAMSULOSIN HCL 0.4 MG PO CAPS
0.4000 mg | ORAL_CAPSULE | Freq: Every day | ORAL | Status: DC
  Administered 2018-01-12 – 2018-01-14 (×3): 0.4 mg via ORAL
  Filled 2018-01-12 (×3): qty 1

## 2018-01-12 MED ORDER — OMEGA-3-ACID ETHYL ESTERS 1 G PO CAPS
1.0000 | ORAL_CAPSULE | Freq: Every day | ORAL | Status: DC
  Administered 2018-01-12 – 2018-01-14 (×3): 1 g via ORAL
  Filled 2018-01-12 (×6): qty 1

## 2018-01-12 MED ORDER — ROSUVASTATIN CALCIUM 20 MG PO TABS
20.0000 mg | ORAL_TABLET | Freq: Every day | ORAL | Status: DC
  Administered 2018-01-12 – 2018-01-13 (×2): 20 mg via ORAL
  Filled 2018-01-12 (×4): qty 1

## 2018-01-12 MED ORDER — ACETAMINOPHEN 325 MG PO TABS: 650.0000 mg | ORAL_TABLET | Freq: Four times a day (QID) | ORAL | Status: DC | PRN

## 2018-01-12 MED ORDER — ALLOPURINOL 100 MG PO TABS
100.0000 mg | ORAL_TABLET | Freq: Every day | ORAL | Status: DC
  Administered 2018-01-12 – 2018-01-13 (×2): 100 mg via ORAL
  Filled 2018-01-12 (×4): qty 1

## 2018-01-12 MED ORDER — ACETAMINOPHEN 650 MG RE SUPP: 650.0000 mg | Freq: Four times a day (QID) | RECTAL | Status: DC | PRN

## 2018-01-12 MED ORDER — SODIUM CHLORIDE 0.9 % IV SOLN
INTRAVENOUS | Status: DC
  Administered 2018-01-12 – 2018-01-14 (×4): via INTRAVENOUS

## 2018-01-12 MED ORDER — ONDANSETRON HCL 4 MG PO TABS: 4.0000 mg | ORAL_TABLET | Freq: Four times a day (QID) | ORAL | Status: DC | PRN

## 2018-01-12 MED ORDER — INSULIN ASPART 100 UNIT/ML ~~LOC~~ SOLN
0.0000 [IU] | Freq: Three times a day (TID) | SUBCUTANEOUS | Status: DC
  Administered 2018-01-12 – 2018-01-13 (×4): 1 [IU] via SUBCUTANEOUS
  Administered 2018-01-13 – 2018-01-14 (×2): 2 [IU] via SUBCUTANEOUS
  Administered 2018-01-14: 1 [IU] via SUBCUTANEOUS
  Filled 2018-01-12: qty 1

## 2018-01-12 MED ORDER — DM-GUAIFENESIN ER 30-600 MG PO TB12
2.0000 | ORAL_TABLET | Freq: Two times a day (BID) | ORAL | Status: DC
  Administered 2018-01-12 – 2018-01-14 (×4): 2 via ORAL
  Filled 2018-01-12 (×4): qty 2

## 2018-01-12 MED ORDER — HEPARIN SODIUM (PORCINE) 5000 UNIT/ML IJ SOLN
5000.0000 [IU] | Freq: Three times a day (TID) | INTRAMUSCULAR | Status: DC
Start: 2018-01-12 — End: 2018-01-14
  Administered 2018-01-12 – 2018-01-14 (×6): 5000 [IU] via SUBCUTANEOUS
  Filled 2018-01-12 (×7): qty 1

## 2018-01-12 MED ORDER — ONDANSETRON HCL 4 MG/2ML IJ SOLN: 4.0000 mg | Freq: Four times a day (QID) | INTRAMUSCULAR | Status: DC | PRN

## 2018-01-12 NOTE — ED Notes (Signed)
Ice chips given [employer]

## 2018-01-12 NOTE — ED Notes (Signed)
Have paged pharmacy for meds 

## 2018-01-12 NOTE — ED Notes (Signed)
Patient transported to X-ray 

## 2018-01-12 NOTE — ED Notes (Signed)
EDP at bedside  

## 2018-01-12 NOTE — ED Notes (Signed)
Have given pt a meal tray after insulin given

## 2018-01-12 NOTE — H&P (Signed)
History and Physical  Bobby Donovan ZOX:[id number] DOB: Dec 04, 1940 DOA: 01/12/2018   PCP: [facility]   Patient coming from: Home  Chief Complaint: generalized weakness  HPI:  Bobby Donovan is a 77 y.o. male with medical history of hypertension, diabetes mellitus, hyperlipidemia presenting with generalized weakness for 3 to 4 days and URI symptoms for approximately 2 weeks.  The patient stated that his symptoms began approximately 12 days prior to admission with a nonproductive cough and URI type symptoms.  His symptoms progressed with worsening cough and some dyspnea on exertion over the next several days.  The patient had some nausea resulting in poor oral intake.  He has subjective fevers and chills.  He denied any vomiting, diarrhea, hematochezia, melena, dysuria, hematuria, abdominal pain, chest pain, headache, dizziness.  Unfortunately, the patient continued to have poor oral intake and developed a generalized weakness over the past 2 to 3 days prior to admission.  As result, the patient presented for further evaluation.  He denies any new medications.  He endorses compliance with all his medications.  He denies any NSAID use.  He denies any recent sick contacts.  The patient quit smoking approximately 30 years ago had a 20-pack-year history of tobacco.  In the emergency department, the patient was afebrile hemodynamically stable saturating 100% on room air.  BMP showed a serum creatinine 2.73.  Otherwise his lactulose were unremarkable.  WBC was 11.9 with hemoglobin 9.8.  Chest x-ray was negative for infiltrates or edema.  EKG shows sinus rhythm with left BBB.    Assessment/Plan: Acute on chronic renal failure--CKD stage III -Baseline creatinine 1.5-1.7 -Presenting creatinine 2.73 -Likely due to volume depletion in the setting of losartan use -Renal ultrasound -Urinalysis -Holding losartan -Check CPK  Generalized weakness -Likely due to his viral  syndrome and acute on chronic renal failure -Viral respiratory panel -Serum B12 -Folic acid -Check CPK -PT evaluation -TSH  Diabetes mellitus type 2 -Hemoglobin A1c -NovoLog sliding scale -Holding p.o. glitazone and metformin  Hyperlipidemia -Continue statin  Essential hypertension -Holding amlodipine secondary to soft blood pressure -Holding losartan secondary to acute kidney injury  Acute bronchitis -Stable on room air -Likely due to viral syndrome -Viral respiratory panel  Leukocytosis -Likely due to stress demargination -Patient is afebrile hemodynamically stable -Monitor off antibiotics  Dyspnea on exertion/LBBB -Echocardiogram     Past Medical History:  Diagnosis Date  . Diabetes mellitus without complication (HCC)    type II  . Gout   . Hypertension    Past Surgical History:  Procedure Laterality Date  . ANKLE FRACTURE SURGERY  04/03/2012   Bimalleolar open treatment internal fixation Stryker implant  . BACK SURGERY  1989  . FINGER SURGERY  1969  . ORIF ANKLE FRACTURE  04/03/2012   Procedure: OPEN REDUCTION INTERNAL FIXATION (ORIF) ANKLE FRACTURE;  Surgeon: Vickki Hearing, MD;  Location: [facility];  Service: Orthopedics;  Laterality: Right;   Social History:  reports that he has never smoked. He has never used smokeless tobacco. He reports that he does not drink alcohol or use drugs.   Family History  Problem Relation Age of Onset  . Diabetes Unknown      No Known Allergies   Prior to Admission medications   Medication Sig Start Date End Date Taking? Authorizing Provider  allopurinol (ZYLOPRIM) 300 MG tablet Take 300 mg by mouth daily with supper.   Yes [provider]  amLODipine (NORVASC) 5 MG tablet  Take 5 mg by mouth daily with supper.   Yes [provider]  aspirin EC 81 MG tablet Take 81 mg by mouth daily with supper.   Yes [provider]  finasteride (PROSCAR) 5 MG tablet Take 1 tablet by mouth daily.  10/23/17  Yes [provider]  losartan (COZAAR) 100 MG tablet Take 100 mg by mouth daily with supper.   Yes [provider]  metFORMIN (GLUCOPHAGE) 500 MG tablet Take 1,000 mg by mouth daily with supper.   Yes [provider]  Omega-3 1000 MG CAPS Take 1 capsule by mouth daily.   Yes [provider]  pioglitazone (ACTOS) 30 MG tablet Take 30 mg by mouth daily with supper.   Yes [provider]  rosuvastatin (CRESTOR) 20 MG tablet Take 20 mg by mouth daily with supper.   Yes [provider]  tamsulosin (FLOMAX) 0.4 MG CAPS capsule Take 1 capsule by mouth daily. 11/24/17  Yes [provider]  cephALEXin (KEFLEX) 500 MG capsule Take 1 capsule (500 mg total) by mouth 2 (two) times daily. Patient not taking: Reported on 01/12/2018 10/06/12   Vickki HearingHarrison, Stanley E, MD  promethazine (PHENERGAN) 12.5 MG tablet Take 5 tablets (62.5 mg total) by mouth every 4 (four) hours as needed for nausea. Patient not taking: Reported on 01/12/2018 04/03/12   Vickki HearingHarrison, Stanley E, MD  sulfamethoxazole-trimethoprim (BACTRIM DS) 800-160 MG per tablet Take 1 tablet by mouth 2 (two) times daily. Patient not taking: Reported on 01/12/2018 01/05/13   Vickki HearingHarrison, Stanley E, MD    Review of Systems:  Constitutional:  No weight loss, night sweats Head&Eyes: No headache.  No vision loss.  No eye pain or scotoma ENT:  No Difficulty swallowing,Tooth/dental problems,Sore throat,  No ear ache, post nasal drip,  Cardio-vascular:  No chest pain, Orthopnea, PND, swelling in lower extremities,  dizziness, palpitations  GI:  No  abdominal pain, nausea, vomiting, diarrhea, loss of appetite, hematochezia, melena, heartburn, indigestion, Resp:  No coughing up of blood .No wheezing.No chest wall deformity  Skin:  no rash or lesions.  GU:  no dysuria, change in color of urine, no urgency or frequency. No flank pain.  Musculoskeletal:  No joint pain or swelling. No decreased range  of motion. No back pain.  Psych:  No change in mood or affect. No depression or anxiety. Neurologic: No headache, no dysesthesia, no focal weakness, no vision loss. No syncope  Physical Exam: Vitals:   01/12/18 1030 01/12/18 1100 01/12/18 1130 01/12/18 1230  BP: 110/61 109/81 120/61 118/67  Pulse: 65 74 72 68  Resp: 19 (!) 27 (!) 25 20  Temp:      TempSrc:      SpO2: 98% 96% 96% 95%  Weight:      Height:       General:  A&O x 3, NAD, nontoxic, pleasant/cooperative Head/Eye: No conjunctival hemorrhage, no icterus, [place]/AT, No nystagmus ENT:  No icterus,  No thrush, good dentition, no pharyngeal exudate Neck:  No masses, no lymphadenpathy, no bruits CV:  RRR, no rub, no gallop, no S3 Lung:  CTAB, good air movement, no wheeze, no rhonchi Abdomen: soft/NT, +BS, nondistended, no peritoneal signs Ext: No cyanosis, No rashes, No petechiae, No lymphangitis, No edema Neuro: CNII-XII intact, strength 4/5 in bilateral upper and lower extremities, no dysmetria  Labs on Admission:  Basic Metabolic Panel: Recent Labs  Lab 01/12/18 0944  NA 137  K 4.4  CL 103  CO2 22  GLUCOSE 181*  BUN 58*  CREATININE 2.73*  CALCIUM 8.6*   Liver Function Tests: No results for input(s): AST, ALT, ALKPHOS, BILITOT, PROT, ALBUMIN in the last 168 hours. No results for input(s): LIPASE, AMYLASE in the last 168 hours. No results for input(s): AMMONIA in the last 168 hours. CBC: Recent Labs  Lab 01/12/18 0944  WBC 11.9*  NEUTROABS 9.8*  HGB 9.8*  HCT 31.0*  MCV 96.0  PLT 169   Coagulation Profile: No results for input(s): INR, PROTIME in the last 168 hours. Cardiac Enzymes: No results for input(s): CKTOTAL, CKMB, CKMBINDEX, TROPONINI in the last 168 hours. BNP: Invalid input(s): POCBNP CBG: Recent Labs  Lab 01/12/18 1259  GLUCAP 150*   Urine analysis: No results found for: COLORURINE, APPEARANCEUR, LABSPEC, PHURINE, GLUCOSEU, HGBUR, BILIRUBINUR, KETONESUR, PROTEINUR, UROBILINOGEN,  NITRITE, LEUKOCYTESUR Sepsis Labs: @LABRCNTIP (procalcitonin:4,lacticidven:4) )No results found for this or any previous visit (from the past 240 hour(s)).   Radiological Exams on Admission: Dg Chest 2 View  Result Date: 01/12/2018 CLINICAL DATA:  Cough, shortness of breath, and chills for 12 days. EXAM: CHEST - 2 VIEW COMPARISON:  None. FINDINGS: Heart size and pulmonary vascularity are normal. 6 mm nodular density at the right lung base probably represents a nipple shadow bladder but I recommend a repeat PA view with nipple markers for confirmation. The lungs are otherwise clear. No effusions. No significant bone abnormality. IMPRESSION: 6 mm nodular density at the right lung base. Repeat PA view with nipple markers recommended. No other significant abnormalities. Electronically Signed   By: Francene BoyersJames  Maxwell M.D.   On: 01/12/2018 10:02   Dg Chest Special View  Result Date: 01/12/2018 CLINICAL DATA:  Cough. Possible nodule seen on prior x-ray. Repeat with nipple markers. EXAM: CHEST SPECIAL VIEW COMPARISON:  Chest x-ray from same day. FINDINGS: The heart size and mediastinal contours are within normal limits. Normal pulmonary vascularity. The 6 mm nodular density at the right lung base corresponds to a nipple shadow. No focal consolidation, pleural effusion, or pneumothorax. No acute osseous abnormality. IMPRESSION: 1. 6 mm nodular density at the right lung base corresponds to a nipple shadow. 2. No active disease. Electronically Signed   By: Obie DredgeWilliam T Derry M.D.   On: 01/12/2018 10:44    EKG: Independently reviewed. Sinus LBBB    Time spent:60 minutes Code Status:   FULL Family Communication:  No Family at bedside Disposition Plan: expect 1-2 day hospitalization Consults called: none  DVT Prophylaxis: [place] Heparin   Catarina HartshornDavid Irelynd Zumstein, DO  [facility] Pager [phone]  If 7PM-7AM, please contact night-coverage [email or web address] Password [email or web address] 01/12/2018, 1:09 PM

## 2018-01-12 NOTE — ED Notes (Signed)
Report given to Megan RN

## 2018-01-12 NOTE — ED Provider Notes (Signed)
[facility] Emergency Department Provider Note MRN:  [id number]  Arrival date & time: 01/12/18     Chief Complaint   Cough   History of Present Illness   Bobby Donovan is a 77 y.o. year-old male with a history of diabetes presenting to the ED with chief complaint of cough.  Patient endorsing 12 days of illness, which began with a mild sore throat.  3 days later, began having a persistent dry cough.  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  Denies chest pain or shortness of breath, no fever, no headache, no abdominal pain, no dysuria.  Review of Systems  A complete 10 system review of systems was obtained and all systems are negative except as noted in the HPI and PMH.   Patient's Health History    Past Medical History:  Diagnosis Date  . Diabetes mellitus without complication (HCC)    type II  . Gout   . Hypertension     Past Surgical History:  Procedure Laterality Date  . ANKLE FRACTURE SURGERY  04/03/2012   Bimalleolar open treatment internal fixation Stryker implant  . BACK SURGERY  1989  . FINGER SURGERY  1969  . ORIF ANKLE FRACTURE  04/03/2012   Procedure: OPEN REDUCTION INTERNAL FIXATION (ORIF) ANKLE FRACTURE;  Surgeon: Vickki HearingStanley E Harrison, MD;  Location: [facility];  Service: Orthopedics;  Laterality: Right;    Family History  Problem Relation Age of Onset  . Diabetes Unknown     Social History   Socioeconomic History  . Marital status: Married    Spouse name: Not on file  . Number of children: Not on file  . Years of education: Not on file  . Highest education level: Not on file  Occupational History  . Not on file  Social Needs  . Financial resource strain: Not on file  . Food insecurity:    Worry: Not on file    Inability: Not on file  . Transportation needs:    Medical: Not on file   Non-medical: Not on file  Tobacco Use  . Smoking status: Never Smoker  . Smokeless tobacco: Never Used  Substance and Sexual Activity  . Alcohol use: No  . Drug use: No  . Sexual activity: Not on file  Lifestyle  . Physical activity:    Days per week: Not on file    Minutes per session: Not on file  . Stress: Not on file  Relationships  . Social connections:    Talks on phone: Not on file    Gets together: Not on file    Attends religious service: Not on file    Active member of club or organization: Not on file    Attends meetings of clubs or organizations: Not on file    Relationship status: Not on file  . Intimate partner violence:    Fear of current or ex partner: Not on file    Emotionally abused: Not on file    Physically abused: Not on file    Forced sexual activity: Not on file  Other Topics Concern  . Not on file  Social History Narrative  . Not on file     Physical Exam  Vital Signs and Nursing Notes reviewed Vitals:   01/12/18 1100 01/12/18 1130  BP: 109/81 120/61  Pulse: 74 72  Resp: (!) 27 (!) 25  Temp:    SpO2: 96% 96%    CONSTITUTIONAL: Well-appearing, NAD NEURO:  Alert and oriented x 3, no focal deficits EYES:  eyes equal and reactive ENT/NECK:  no LAD, no JVD CARDIO: Regular rate, well-perfused, normal S1 and S2 PULM: Scattered crackles bilateral bases GI/GU:  normal bowel sounds, non-distended, non-tender MSK/SPINE:  No gross deformities, no edema SKIN:  no rash, atraumatic PSYCH:  Appropriate speech and behavior  Diagnostic and Interventional Summary    EKG Interpretation  Date/Time:  Monday January 12 2018 09:39:28 EDT Ventricular Rate:  73 PR Interval:    QRS Duration: 148 QT Interval:  421 QTC Calculation: 464 R Axis:   32 Text Interpretation:  Sinus rhythm Atrial premature complex Left bundle branch block Confirmed by Kennis CarinaBero, Alayasia Breeding [phone]([id number]) on 01/12/2018 9:42:28 AM      Labs Reviewed  CBC WITH DIFFERENTIAL/PLATELET - Abnormal;  Notable for the following components:      Result Value   WBC 11.9 (*)    RBC 3.23 (*)    Hemoglobin 9.8 (*)    HCT 31.0 (*)    Neutro Abs 9.8 (*)    Lymphs Abs 0.6 (*)    Monocytes Absolute 1.4 (*)    All other components within normal limits  BASIC METABOLIC PANEL - Abnormal; Notable for the following components:   Glucose, Bld 181 (*)    BUN 58 (*)    Creatinine, Ser 2.73 (*)    Calcium 8.6 (*)    GFR calc non Af Amer 21 (*)    GFR calc Af Amer 25 (*)    All other components within normal limits    DG Chest Special View  Final Result    DG Chest 2 View  Final Result      Medications  sodium chloride 0.9 % bolus 1,000 mL (1,000 mLs Intravenous New Bag/Given 01/12/18 1112)     Procedures Critical Care  ED Course and Medical Decision Making  I have reviewed the triage vital signs and the nursing notes.  Pertinent labs & imaging results that were available during my care of the patient were reviewed by me and considered in my medical decision making (see below for details).    Question of viral illness versus secondary bacterial pneumonia in this 77 year old male, history of diabetes but otherwise very active and healthy.  Well-appearing with stable vitals.  Favoring outpatient management, labs and x-ray pending.  Chest x-ray reveals no pneumonia, labs reveal significant AKI.  Given 1 L IV fluids here in the ED, admitted to hospitalist service for further care and evaluation.  Elmer SowMichael M. Pilar PlateBero, MD [facility] [email or web address]  Final Clinical Impressions(s) / ED Diagnoses     ICD-10-CM   1. AKI (acute kidney injury) (HCC) N17.9   2. Cough R05 DG Chest Special View    DG Chest Special View  3. Acute bronchitis, unspecified organism J20.9     ED Discharge Orders    None         Sabas SousBero, Charissa Knowles M, MD 01/12/18 1213

## 2018-01-12 NOTE — ED Triage Notes (Signed)
Patient complaining of cough, shortness of breath, of generalized body aches x 2 weeks. States he is coughing up green sputum.

## 2018-01-13 ENCOUNTER — Observation Stay (HOSPITAL_COMMUNITY): Payer: Medicare Other

## 2018-01-13 DIAGNOSIS — R531 Weakness: Secondary | ICD-10-CM | POA: Diagnosis not present

## 2018-01-13 DIAGNOSIS — J209 Acute bronchitis, unspecified: Secondary | ICD-10-CM | POA: Diagnosis not present

## 2018-01-13 DIAGNOSIS — I10 Essential (primary) hypertension: Secondary | ICD-10-CM | POA: Diagnosis not present

## 2018-01-13 DIAGNOSIS — N179 Acute kidney failure, unspecified: Secondary | ICD-10-CM | POA: Diagnosis not present

## 2018-01-13 LAB — GLUCOSE, CAPILLARY
GLUCOSE-CAPILLARY: 131 mg/dL — AB (ref 70–99)
GLUCOSE-CAPILLARY: 197 mg/dL — AB (ref 70–99)
GLUCOSE-CAPILLARY: 205 mg/dL — AB (ref 70–99)
Glucose-Capillary: 133 mg/dL — ABNORMAL HIGH (ref 70–99)

## 2018-01-13 LAB — RESPIRATORY PANEL BY PCR
ADENOVIRUS-RVPPCR: NOT DETECTED
Bordetella pertussis: NOT DETECTED
CORONAVIRUS 229E-RVPPCR: NOT DETECTED
CORONAVIRUS HKU1-RVPPCR: NOT DETECTED
CORONAVIRUS OC43-RVPPCR: NOT DETECTED
Chlamydophila pneumoniae: NOT DETECTED
Coronavirus NL63: NOT DETECTED
INFLUENZA B-RVPPCR: NOT DETECTED
Influenza A: NOT DETECTED
METAPNEUMOVIRUS-RVPPCR: NOT DETECTED
MYCOPLASMA PNEUMONIAE-RVPPCR: NOT DETECTED
PARAINFLUENZA VIRUS 1-RVPPCR: NOT DETECTED
PARAINFLUENZA VIRUS 2-RVPPCR: NOT DETECTED
Parainfluenza Virus 3: NOT DETECTED
Parainfluenza Virus 4: NOT DETECTED
RESPIRATORY SYNCYTIAL VIRUS-RVPPCR: NOT DETECTED
Rhinovirus / Enterovirus: DETECTED — AB

## 2018-01-13 LAB — CBC
HEMATOCRIT: 26.5 % — AB (ref 39.0–52.0)
Hemoglobin: 8.4 g/dL — ABNORMAL LOW (ref 13.0–17.0)
MCH: 30.5 pg (ref 26.0–34.0)
MCHC: 31.7 g/dL (ref 30.0–36.0)
MCV: 96.4 fL (ref 78.0–100.0)
Platelets: 160 10*3/uL (ref 150–400)
RBC: 2.75 MIL/uL — ABNORMAL LOW (ref 4.22–5.81)
RDW: 12.9 % (ref 11.5–15.5)
WBC: 8.8 10*3/uL (ref 4.0–10.5)

## 2018-01-13 LAB — BASIC METABOLIC PANEL
Anion gap: 7 (ref 5–15)
BUN: 52 mg/dL — ABNORMAL HIGH (ref 8–23)
CHLORIDE: 107 mmol/L (ref 98–111)
CO2: 23 mmol/L (ref 22–32)
Calcium: 7.8 mg/dL — ABNORMAL LOW (ref 8.9–10.3)
Creatinine, Ser: 2.59 mg/dL — ABNORMAL HIGH (ref 0.61–1.24)
GFR calc Af Amer: 26 mL/min — ABNORMAL LOW (ref >60)
GFR, EST NON AFRICAN AMERICAN: 23 mL/min — AB (ref >60)
GLUCOSE: 155 mg/dL — AB (ref 70–99)
POTASSIUM: 4.7 mmol/L (ref 3.5–5.1)
Sodium: 137 mmol/L (ref 135–145)

## 2018-01-13 NOTE — Care Management Obs Status (Signed)
MEDICARE OBSERVATION STATUS NOTIFICATION   Patient Details  Name: Bobby RabonDonald E Donovan MRN: [id number] Date of Birth: [birth year]   Medicare Observation Status Notification Given:  Yes    Vicki Chaffin, Chrystine OilerSharley Diane, RN 01/13/2018, 3:05 PM

## 2018-01-13 NOTE — Evaluation (Signed)
Physical Therapy Evaluation Patient Details Name: Bobby RabonDonald E Donovan MRN: [id number] DOB: 1941/01/14 Today's Date: 01/13/2018   History of Present Illness  77 yo male with onset of bronchitis was admitted OBS, has leukocytosis, lung nodule, acute kidney disease, weakness and SOB with exertion.  PMHx:  L BBB, URI, HTN, DM,   Clinical Impression  Pt was seen for mobility assessment, noted his O2 sats were 98% with mobility and had no signs of imbalance.  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  Follow up is not needed unless he develops a change in his functional level.      Follow Up Recommendations No PT follow up    Equipment Recommendations  None recommended by PT    Recommendations for Other Services       Precautions / Restrictions Precautions Precautions: Other (comment)(Droplet) Precaution Comments: droplet Restrictions Weight Bearing Restrictions: No      Mobility  Bed Mobility Overal bed mobility: Modified Independent                Transfers Overall transfer level: Modified independent Equipment used: None                Ambulation/Gait Ambulation/Gait assistance: Supervision(only for safety) Gait Distance (Feet): 30 Feet(due to droplet precautions) Assistive device: None Gait Pattern/deviations: Step-through pattern;Decreased stride length;Narrow base of support Gait velocity: normal Gait velocity interpretation: <1.31 ft/sec, indicative of household ambulator General Gait Details: able to navigate with no assistance in his room  Stairs            Wheelchair Mobility    Modified Rankin (Stroke Patients Only)       Balance Overall balance assessment: No apparent balance deficits (not formally assessed)                                           Pertinent Vitals/Pain Pain Assessment: No/denies pain    Home Living Family/patient expects to be  discharged to:: Private residence Living Arrangements: Spouse/significant other Available Help at Discharge: Family;Available 24 hours/day Type of Home: House Home Access: Ramped entrance     Home Layout: One level Home Equipment: Wheelchair - manual      Prior Function Level of Independence: Independent         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     Hand Dominance   Dominant Hand: Right    Extremity/Trunk Assessment   Upper Extremity Assessment Upper Extremity Assessment: Overall WFL for tasks assessed    Lower Extremity Assessment Lower Extremity Assessment: Overall WFL for tasks assessed    Cervical / Trunk Assessment Cervical / Trunk Assessment: Kyphotic  Communication   Communication: No difficulties  Cognition Arousal/Alertness: Awake/alert Behavior During Therapy: WFL for tasks assessed/performed Overall Cognitive Status: Within Functional Limits for tasks assessed                                        General Comments      Exercises     Assessment/Plan    PT Assessment Patent does not need any further PT services  PT Problem List         PT Treatment Interventions      PT Goals (Current  goals can be found in the Care Plan section)  Acute Rehab PT Goals Patient Stated Goal: to go home PT Goal Formulation: All assessment and education complete, DC therapy    Frequency     Barriers to discharge        Co-evaluation               AM-PAC PT "6 Clicks" Daily Activity  Outcome Measure Difficulty turning over in bed (including adjusting bedclothes, sheets and blankets)?: None Difficulty moving from lying on back to sitting on the side of the bed? : None Difficulty sitting down on and standing up from a chair with arms (e.g., wheelchair, bedside commode, etc,.)?: None Help needed moving to and from a bed to chair (including a wheelchair)?: None Help needed walking in hospital room?:  None Help needed climbing 3-5 steps with a railing? : None 6 Click Score: 24    End of Session Equipment Utilized During Treatment: Gait belt Activity Tolerance: Patient tolerated treatment well Patient left: in bed;with call bell/phone within reach;with family/visitor present Nurse Communication: Mobility status PT Visit Diagnosis: Muscle weakness (generalized) (M62.81)    Time: [id number] PT Time Calculation (min) (ACUTE ONLY): 16 min   Charges:   PT Evaluation $PT Eval Moderate Complexity: 1 Mod         Ivar DrapeRuth E Annalei Friesz 01/13/2018, 1:31 PM   Samul Dadauth Jerelyn Trimarco, PT MS Acute Rehab Dept. Number: [facility] [phone] and [facility] [phone]

## 2018-01-13 NOTE — Progress Notes (Signed)
PROGRESS NOTE  Bobby Donovan ZOX:[id number] DOB: [birth year] DOA: 01/12/2018 PCP: [facility]  Brief History:  77 y.o. male with medical history of hypertension, diabetes mellitus, hyperlipidemia presenting with generalized weakness for 3 to 4 days and URI symptoms for approximately 2 weeks.  The patient stated that his symptoms began approximately 12 days prior to admission with a nonproductive cough and URI type symptoms.  His symptoms progressed with worsening cough and some dyspnea on exertion over the next several days.  The patient had some nausea resulting in poor oral intake.  He has subjective fevers and chills.  He denied any vomiting, diarrhea, hematochezia, melena, dysuria, hematuria, abdominal pain, chest pain, headache, dizziness.  Unfortunately, the patient continued to have poor oral intake and developed a generalized weakness over the past 2 to 3 days prior to admission.  As result, the patient presented for further evaluation.  Assessment/Plan: Acute on chronic renal failure--CKD stage III -Baseline creatinine 1.5-1.7 (per his PCP) -Presenting creatinine 2.73 -Likely due to volume depletion in the setting of losartan use -Renal ultrasound--neg for hydroneprhosis;  Right kidney cortical bulge-->nonemergent MRI of kidneys when renal function able to allow for contrast -Urinalysis--no proteinuria -Holding losartan -Check CPK--59  Generalized weakness -Likely due to his viral syndrome and acute on chronic renal failure -Viral respiratory panel--pending -Serum B12--2105 -Folic acid--12.0 -Check CPK-59 -PT evaluation--no follow up needed -TSH--0.299, Free T4 1.12 = euthyroid sick  Diabetes mellitus type 2 -Hemoglobin A1--7.3c -NovoLog sliding scale -Holding pioglitazone and metformin  Hyperlipidemia -Continue statin  Essential hypertension -Holding amlodipine secondary to soft blood pressure -Holding losartan secondary to  acute kidney injury -BP remains controlled  Acute bronchitis -Stable on room air -Likely due to viral syndrome -Viral respiratory panel--pending  Leukocytosis -Likely due to stress demargination -Patient is afebrile hemodynamically stable -Monitor off antibiotics -resolved  Dyspnea on exertion/LBBB -Echocardiogram   Disposition Plan:   Home 8/21 if stable Family Communication:   Spouse updated at bedside 8/20  Consultants:  none  Code Status:  FULL  DVT Prophylaxis:  [place]arton Heparin    Procedures: As Listed in Progress Note Above  Antibiotics: None    Subjective: Patient denies fevers, chills, headache, chest pain, dyspnea, nausea, vomiting, diarrhea, abdominal pain, dysuria, hematuria, hematochezia, and melena.   Objective: Vitals:   01/12/18 1300 01/12/18 2238 01/13/18 0643 01/13/18 1436  BP: 118/67 127/63 118/62 119/64  Pulse: 73 77 65 60  Resp: 19 20 18 20   Temp:  98.1 F (36.7 C) 98.9 F (37.2 C) (!) 97.4 F (36.3 C)  TempSrc:  Oral Oral Oral  SpO2: 96% 96% 96% 98%  Weight:      Height:        Intake/Output Summary (Last 24 hours) at 01/13/2018 1503 Last data filed at 01/13/2018 1300 Gross per 24 hour  Intake 2228.75 ml  Output 876 ml  Net 1352.75 ml   Weight change:  Exam:   General:  Pt is alert, follows commands appropriately, not in acute distress  HEENT: No icterus, No thrush, No neck mass, [place]/AT  Cardiovascular: RRR, S1/S2, no rubs, no gallops  Respiratory: CTA bilaterally, no wheezing, no crackles, no rhonchi  Abdomen: Soft/+BS, non tender, non distended, no guarding  Extremities: No edema, No lymphangitis, No petechiae, No rashes, no synovitis   Data Reviewed: I have personally reviewed following labs and imaging studies Basic Metabolic Panel: Recent Labs  Lab 01/12/18 0944 01/13/18 0524  NA  137 137  K 4.4 4.7  CL 103 107  CO2 22 23  GLUCOSE 181* 155*  BUN 58* 52*  CREATININE 2.73* 2.59*  CALCIUM 8.6* 7.8*    Liver Function Tests: No results for input(s): AST, ALT, ALKPHOS, BILITOT, PROT, ALBUMIN in the last 168 hours. No results for input(s): LIPASE, AMYLASE in the last 168 hours. No results for input(s): AMMONIA in the last 168 hours. Coagulation Profile: No results for input(s): INR, PROTIME in the last 168 hours. CBC: Recent Labs  Lab 01/12/18 0944 01/13/18 0524  WBC 11.9* 8.8  NEUTROABS 9.8*  --   HGB 9.8* 8.4*  HCT 31.0* 26.5*  MCV 96.0 96.4  PLT 169 160   Cardiac Enzymes: Recent Labs  Lab 01/12/18 1427  CKTOTAL 59   BNP: Invalid input(s): POCBNP CBG: Recent Labs  Lab 01/12/18 1259 01/12/18 1531 01/12/18 2235 01/13/18 0820 01/13/18 1126  GLUCAP 150* 148* 168* 131* 197*   HbA1C: Recent Labs    01/12/18 1427  HGBA1C 7.3*   Urine analysis:    Component Value Date/Time   COLORURINE YELLOW 01/12/2018 1458   APPEARANCEUR HAZY (A) 01/12/2018 1458   LABSPEC 1.012 01/12/2018 1458   PHURINE 5.0 01/12/2018 1458   GLUCOSEU NEGATIVE 01/12/2018 1458   HGBUR NEGATIVE 01/12/2018 1458   BILIRUBINUR NEGATIVE 01/12/2018 1458   KETONESUR NEGATIVE 01/12/2018 1458   PROTEINUR NEGATIVE 01/12/2018 1458   NITRITE NEGATIVE 01/12/2018 1458   LEUKOCYTESUR NEGATIVE 01/12/2018 1458   Sepsis Labs: @LABRCNTIP (procalcitonin:4,lacticidven:4) ) Recent Results (from the past 240 hour(s))  Culture, Urine     Status: Abnormal (Preliminary result)   Collection Time: 01/12/18  2:58 PM  Result Value Ref Range Status   Specimen Description   Final    URINE, RANDOM Performed at [facility], [address] [place], [place] [address]    Special Requests   Final    NONE Performed at [facility], [address] [place], [place] [address]    Culture (A)  Final    10,000 COLONIES/mL UNIDENTIFIED ORGANISM Performed at [facility], [address] [place], [place] [address]    Report Status PENDING  Incomplete     Scheduled Meds: . allopurinol  100 mg Oral Q supper  .  aspirin EC  81 mg Oral Q supper  . dextromethorphan-guaiFENesin  2 tablet Oral BID  . finasteride  5 mg Oral Daily  . heparin  5,000 Units Subcutaneous Q8H  . insulin aspart  0-9 Units Subcutaneous TID WC  . omega-3 acid ethyl esters  1 capsule Oral Daily  . rosuvastatin  20 mg Oral Q supper  . tamsulosin  0.4 mg Oral Daily   Continuous Infusions: . sodium chloride 75 mL/hr at 01/13/18 0535    Procedures/Studies: Dg Chest 2 View  Result Date: 01/12/2018 CLINICAL DATA:  Cough, shortness of breath, and chills for 12 days. EXAM: CHEST - 2 VIEW COMPARISON:  None. FINDINGS: Heart size and pulmonary vascularity are normal. 6 mm nodular density at the right lung base probably represents a nipple shadow bladder but I recommend a repeat PA view with nipple markers for confirmation. The lungs are otherwise clear. No effusions. No significant bone abnormality. IMPRESSION: 6 mm nodular density at the right lung base. Repeat PA view with nipple markers recommended. No other significant abnormalities. Electronically Signed   By: Francene BoyersJames  Maxwell M.D.   On: 01/12/2018 10:02   [place] Renal  Result Date: 01/12/2018 CLINICAL DATA:  Acute kidney injury EXAM: RENAL / URINARY  TRACT ULTRASOUND COMPLETE COMPARISON:  None. FINDINGS: Right Kidney: Length: 8.8 cm. The renal cortical echotexture remains lower than that of the adjacent liver. There is a cortical contour bump over the midpole of the right kidney laterally. The echotexture of the parenchyma here is similar to that of the surrounding parenchyma. There is no hydronephrosis. Left Kidney: Length: 9.4 cm. The renal cortical echotexture is similar to that on the right. There is no hydronephrosis. Bladder: The prostate gland is very enlarged measuring 6.3 x 5.3 cm and transverse and AP dimensions. In longitudinal dimension it measures 7.5 cm. The urinary bladder is only partially distended. IMPRESSION: There is a cortical bulge in the midpole of the right kidney that is  similar in echotexture to surrounding cortex. A discrete mass is not excluded however. Renal protocol MRI is recommended. The renal cortical echotexture both kidneys outside of the midpole contour abnormality on the right appears normal. There is no hydronephrosis. Markedly enlarged prostate gland producing a marked impression upon the urinary bladder base. Electronically Signed   By: Sherilynn Dieu  [place] M.D.   On: 01/12/2018 15:12   Dg Chest Special View  Result Date: 01/12/2018 CLINICAL DATA:  Cough. Possible nodule seen on prior x-ray. Repeat with nipple markers. EXAM: CHEST SPECIAL VIEW COMPARISON:  Chest x-ray from same day. FINDINGS: The heart size and mediastinal contours are within normal limits. Normal pulmonary vascularity. The 6 mm nodular density at the right lung base corresponds to a nipple shadow. No focal consolidation, pleural effusion, or pneumothorax. No acute osseous abnormality. IMPRESSION: 1. 6 mm nodular density at the right lung base corresponds to a nipple shadow. 2. No active disease. Electronically Signed   By: Obie DredgeWilliam T Derry M.D.   On: 01/12/2018 10:44    Catarina Hartshornavid Windsor Zirkelbach, DO  [facility] Pager [phone]  If 7PM-7AM, please contact night-coverage [email or web address] Password [email or web address] 01/13/2018, 3:03 PM   LOS: 1 day

## 2018-01-14 ENCOUNTER — Other Ambulatory Visit (HOSPITAL_COMMUNITY): Payer: Medicare Other

## 2018-01-14 DIAGNOSIS — Z7982 Long term (current) use of aspirin: Secondary | ICD-10-CM | POA: Diagnosis not present

## 2018-01-14 DIAGNOSIS — J209 Acute bronchitis, unspecified: Secondary | ICD-10-CM | POA: Diagnosis present

## 2018-01-14 DIAGNOSIS — M109 Gout, unspecified: Secondary | ICD-10-CM | POA: Diagnosis present

## 2018-01-14 DIAGNOSIS — N170 Acute kidney failure with tubular necrosis: Secondary | ICD-10-CM

## 2018-01-14 DIAGNOSIS — N179 Acute kidney failure, unspecified: Secondary | ICD-10-CM | POA: Diagnosis present

## 2018-01-14 DIAGNOSIS — N183 Chronic kidney disease, stage 3 (moderate): Secondary | ICD-10-CM

## 2018-01-14 DIAGNOSIS — J069 Acute upper respiratory infection, unspecified: Secondary | ICD-10-CM | POA: Diagnosis present

## 2018-01-14 DIAGNOSIS — N185 Chronic kidney disease, stage 5: Secondary | ICD-10-CM

## 2018-01-14 DIAGNOSIS — E785 Hyperlipidemia, unspecified: Secondary | ICD-10-CM | POA: Diagnosis present

## 2018-01-14 DIAGNOSIS — E869 Volume depletion, unspecified: Secondary | ICD-10-CM | POA: Diagnosis present

## 2018-01-14 DIAGNOSIS — R05 Cough: Secondary | ICD-10-CM | POA: Diagnosis present

## 2018-01-14 DIAGNOSIS — E1122 Type 2 diabetes mellitus with diabetic chronic kidney disease: Secondary | ICD-10-CM | POA: Diagnosis present

## 2018-01-14 DIAGNOSIS — I447 Left bundle-branch block, unspecified: Secondary | ICD-10-CM | POA: Diagnosis present

## 2018-01-14 DIAGNOSIS — I129 Hypertensive chronic kidney disease with stage 1 through stage 4 chronic kidney disease, or unspecified chronic kidney disease: Secondary | ICD-10-CM | POA: Diagnosis present

## 2018-01-14 LAB — BASIC METABOLIC PANEL
ANION GAP: 6 (ref 5–15)
BUN: 41 mg/dL — ABNORMAL HIGH (ref 8–23)
CO2: 22 mmol/L (ref 22–32)
Calcium: 8 mg/dL — ABNORMAL LOW (ref 8.9–10.3)
Chloride: 109 mmol/L (ref 98–111)
Creatinine, Ser: 2.15 mg/dL — ABNORMAL HIGH (ref 0.61–1.24)
GFR calc Af Amer: 33 mL/min — ABNORMAL LOW (ref >60)
GFR, EST NON AFRICAN AMERICAN: 28 mL/min — AB (ref >60)
GLUCOSE: 169 mg/dL — AB (ref 70–99)
Potassium: 4.9 mmol/L (ref 3.5–5.1)
Sodium: 137 mmol/L (ref 135–145)

## 2018-01-14 LAB — URINE CULTURE

## 2018-01-14 LAB — GLUCOSE, CAPILLARY
Glucose-Capillary: 143 mg/dL — ABNORMAL HIGH (ref 70–99)
Glucose-Capillary: 172 mg/dL — ABNORMAL HIGH (ref 70–99)

## 2018-01-14 NOTE — Discharge Instructions (Signed)
Stop taking your Cozaar.

## 2018-01-14 NOTE — Discharge Summary (Signed)
Physician Discharge Summary  Bobby Donovan:[id number] DOB: May 13, 1941 DOA: 01/12/2018  PCP: [facility]  Admit date: 01/12/2018 Discharge date: 01/14/2018  Time spent: 45 minutes  Recommendations for Outpatient Follow-up:  -Will be discharged home today. -Needs follow-up with PCP in 1 week follow renal function. -Patient has been taken off the Cozaar and metformin due to renal dysfunction. -Needs nonemergent MRI of the kidneys to assess for possible kidney mass.  Discharge Diagnoses:  Active Problems:   AKI (acute kidney injury) (HCC)   Acute on chronic renal failure (HCC)   Generalized weakness   Essential hypertension   Acute renal failure superimposed on stage 3 chronic kidney disease (HCC)   Discharge Condition: Stable and improved  Filed Weights   01/12/18 0936  Weight: 80.3 kg    History of present illness:  As per Dr. Arbutus Leas on 8/19: Bobby Donovan is a 77 y.o. male with medical history of hypertension, diabetes mellitus, hyperlipidemia presenting with generalized weakness for 3 to 4 days and URI symptoms for approximately 2 weeks.  The patient stated that his symptoms began approximately 12 days prior to admission with a nonproductive cough and URI type symptoms.  His symptoms progressed with worsening cough and some dyspnea on exertion over the next several days.  The patient had some nausea resulting in poor oral intake.  He has subjective fevers and chills.  He denied any vomiting, diarrhea, hematochezia, melena, dysuria, hematuria, abdominal pain, chest pain, headache, dizziness.  Unfortunately, the patient continued to have poor oral intake and developed a generalized weakness over the past 2 to 3 days prior to admission.  As result, the patient presented for further evaluation.  He denies any new medications.  He endorses compliance with all his medications.  He denies any NSAID use.  He denies any recent sick contacts.  The patient quit  smoking approximately 30 years ago had a 20-pack-year history of tobacco.  In the emergency department, the patient was afebrile hemodynamically stable saturating 100% on room air.  BMP showed a serum creatinine 2.73.  Otherwise his lactulose were unremarkable.  WBC was 11.9 with hemoglobin 9.8.  Chest x-ray was negative for infiltrates or edema.  EKG shows sinus rhythm with left BBB.    Hospital Course:   Acute on chronic kidney disease stage III -Baseline creatinine appears to be between 1.5 and 1.7. -Presenting creatinine was 2.73 and is down to 2.15 on discharge. -Suspect due to volume depletion in the setting of losartan use. -Renal ultrasound was negative for hydronephrosis but there was a right kidney cortical bulge, cannot rule out mass, radiologist is recommending a nonemergent MRI when renal function allows for use of contrast. -Have recommended outpatient follow-up in 1 week to reassess kidney function.  Generalized weakness -Resolved, patient feels back at baseline, has ambulated in the hospital without issues. -PT evaluation has recommended no further follow-up. -B12 was within normal limits at 2100. -Thyroid panel was consistent with sick euthyroid syndrome with a low TSH at 0.299 and a normal free T4 at 1.12.  Would recommend rechecking thyroid function test in 6 to 8 weeks.  Type 2 diabetes -Fairly well controlled while hospitalized, A1c is high at 7.3. -Resume pioglitazone on discharge, will hold metformin due to renal dysfunction and risk for lactic acidosis.  Given his baseline renal dysfunction he should not be placed back on metformin.  Hyperlipidemia -Continue statin  Hypertension -Well-controlled, continue Norvasc, hold losartan due to acute renal failure.  Viral URI -Improved.    Procedures:  None  Consultations:  None  Discharge Instructions  Discharge Instructions    Diet - low sodium heart healthy   Complete by:  As directed    Increase  activity slowly   Complete by:  As directed      Allergies as of 01/14/2018   No Known Allergies     Medication List    STOP taking these medications   cephALEXin 500 MG capsule Commonly known as:  KEFLEX   losartan 100 MG tablet Commonly known as:  COZAAR   metFORMIN 500 MG tablet Commonly known as:  GLUCOPHAGE   promethazine 12.5 MG tablet Commonly known as:  PHENERGAN   sulfamethoxazole-trimethoprim 800-160 MG per tablet Commonly known as:  BACTRIM DS     TAKE these medications   allopurinol 300 MG tablet Commonly known as:  ZYLOPRIM Take 300 mg by mouth daily with supper.   amLODipine 5 MG tablet Commonly known as:  NORVASC Take 5 mg by mouth daily with supper.   aspirin EC 81 MG tablet Take 81 mg by mouth daily with supper.   finasteride 5 MG tablet Commonly known as:  PROSCAR Take 1 tablet by mouth daily.   Omega-3 1000 MG Caps Take 1 capsule by mouth daily.   pioglitazone 30 MG tablet Commonly known as:  ACTOS Take 30 mg by mouth daily with supper.   rosuvastatin 20 MG tablet Commonly known as:  CRESTOR Take 20 mg by mouth daily with supper.   tamsulosin 0.4 MG Caps capsule Commonly known as:  FLOMAX Take 1 capsule by mouth daily.      No Known Allergies Follow-up Information    The [facility] Schedule an appointment as soon as possible for a visit in 1 week(s).   Contact information: [address] [place] [address] [phone]            The results of significant diagnostics from this hospitalization (including imaging, microbiology, ancillary and laboratory) are listed below for reference.    Significant Diagnostic Studies: Dg Chest 2 View  Result Date: 01/12/2018 CLINICAL DATA:  Cough, shortness of breath, and chills for 12 days. EXAM: CHEST - 2 VIEW COMPARISON:  None. FINDINGS: Heart size and pulmonary vascularity are normal. 6 mm nodular density at the right lung base probably represents a nipple  shadow bladder but I recommend a repeat PA view with nipple markers for confirmation. The lungs are otherwise clear. No effusions. No significant bone abnormality. IMPRESSION: 6 mm nodular density at the right lung base. Repeat PA view with nipple markers recommended. No other significant abnormalities. Electronically Signed   By: Francene Boyers M.D.   On: 01/12/2018 10:02   US Renal  Result Date: 01/12/2018 CLINICAL DATA:  Acute kidney injury EXAM: RENAL / URINARY TRACT ULTRASOUND COMPLETE COMPARISON:  None. FINDINGS: Right Kidney: Length: 8.8 cm. The renal cortical echotexture remains lower than that of the adjacent liver. There is a cortical contour bump over the midpole of the right kidney laterally. The echotexture of the parenchyma here is similar to that of the surrounding parenchyma. There is no hydronephrosis. Left Kidney: Length: 9.4 cm. The renal cortical echotexture is similar to that on the right. There is no hydronephrosis. Bladder: The prostate gland is very enlarged measuring 6.3 x 5.3 cm and transverse and AP dimensions. In longitudinal dimension it measures 7.5 cm. The urinary bladder is only partially distended. IMPRESSION: There is a cortical bulge in the  midpole of the right kidney that is similar in echotexture to surrounding cortex. A discrete mass is not excluded however. Renal protocol MRI is recommended. The renal cortical echotexture both kidneys outside of the midpole contour abnormality on the right appears normal. There is no hydronephrosis. Markedly enlarged prostate gland producing a marked impression upon the urinary bladder base. Electronically Signed   By: David  [place] M.D.   On: 01/12/2018 15:12   Dg Chest Special View  Result Date: 01/12/2018 CLINICAL DATA:  Cough. Possible nodule seen on prior x-ray. Repeat with nipple markers. EXAM: CHEST SPECIAL VIEW COMPARISON:  Chest x-ray from same day. FINDINGS: The heart size and mediastinal contours are within normal limits.  Normal pulmonary vascularity. The 6 mm nodular density at the right lung base corresponds to a nipple shadow. No focal consolidation, pleural effusion, or pneumothorax. No acute osseous abnormality. IMPRESSION: 1. 6 mm nodular density at the right lung base corresponds to a nipple shadow. 2. No active disease. Electronically Signed   By: Obie DredgeWilliam T Derry M.D.   On: 01/12/2018 10:44    Microbiology: Recent Results (from the past 240 hour(s))  Culture, Urine     Status: Abnormal   Collection Time: 01/12/18  2:58 PM  Result Value Ref Range Status   Specimen Description   Final    URINE, RANDOM Performed at [facility], [address] [place], [place] [address]    Special Requests   Final    NONE Performed at [facility], [address] [place], [place] [address]    Culture 10,000 COLONIES/mL ENTEROCOCCUS FAECALIS (A)  Final   Report Status 01/14/2018 FINAL  Final   Organism ID, Bacteria ENTEROCOCCUS FAECALIS (A)  Final      Susceptibility   Enterococcus faecalis - MIC*    AMPICILLIN <=2 SENSITIVE Sensitive     LEVOFLOXACIN 1 SENSITIVE Sensitive     NITROFURANTOIN <=16 SENSITIVE Sensitive     VANCOMYCIN 2 SENSITIVE Sensitive     * 10,000 COLONIES/mL ENTEROCOCCUS FAECALIS  Respiratory Panel by PCR     Status: Abnormal   Collection Time: 01/12/18  8:00 PM  Result Value Ref Range Status   Adenovirus NOT DETECTED NOT DETECTED Final   Coronavirus 229E NOT DETECTED NOT DETECTED Final   Coronavirus HKU1 NOT DETECTED NOT DETECTED Final   Coronavirus NL63 NOT DETECTED NOT DETECTED Final   Coronavirus OC43 NOT DETECTED NOT DETECTED Final   Metapneumovirus NOT DETECTED NOT DETECTED Final   Rhinovirus / Enterovirus DETECTED (A) NOT DETECTED Final   Influenza A NOT DETECTED NOT DETECTED Final   Influenza B NOT DETECTED NOT DETECTED Final   Parainfluenza Virus 1 NOT DETECTED NOT DETECTED Final   Parainfluenza Virus 2 NOT DETECTED NOT DETECTED Final   Parainfluenza Virus 3 NOT DETECTED NOT  DETECTED Final   Parainfluenza Virus 4 NOT DETECTED NOT DETECTED Final   Respiratory Syncytial Virus NOT DETECTED NOT DETECTED Final   Bordetella pertussis NOT DETECTED NOT DETECTED Final   Chlamydophila pneumoniae NOT DETECTED NOT DETECTED Final   Mycoplasma pneumoniae NOT DETECTED NOT DETECTED Final    Comment: Performed at [facility], [address] [place], [place] [address]     Labs: Basic Metabolic Panel: Recent Labs  Lab 01/12/18 0944 01/13/18 0524 01/14/18 0453  NA 137 137 137  K 4.4 4.7 4.9  CL 103 107 109  CO2 22 23 22   GLUCOSE 181* 155* 169*  BUN 58* 52* 41*  CREATININE 2.73* 2.59* 2.15*  CALCIUM 8.6*  7.8* 8.0*   Liver Function Tests: No results for input(s): AST, ALT, ALKPHOS, BILITOT, PROT, ALBUMIN in the last 168 hours. No results for input(s): LIPASE, AMYLASE in the last 168 hours. No results for input(s): AMMONIA in the last 168 hours. CBC: Recent Labs  Lab 01/12/18 0944 01/13/18 0524  WBC 11.9* 8.8  NEUTROABS 9.8*  --   HGB 9.8* 8.4*  HCT 31.0* 26.5*  MCV 96.0 96.4  PLT 169 160   Cardiac Enzymes: Recent Labs  Lab 01/12/18 1427  CKTOTAL 59   BNP: BNP (last 3 results) No results for input(s): BNP in the last 8760 hours.  ProBNP (last 3 results) No results for input(s): PROBNP in the last 8760 hours.  CBG: Recent Labs  Lab 01/13/18 1126 01/13/18 1620 01/13/18 2035 01/14/18 0723 01/14/18 1114  GLUCAP 197* 133* 205* 143* 172*       Signed:  Chaya JanEstela Hernandez Acosta  [facility] Pager: [phone] 01/14/2018, 12:00 PM

## 2018-01-15 NOTE — Progress Notes (Signed)
Patient is to be discharged home and in stable condition. Patient's IV removed, WNL. Patient given discharge instructions and verbalized understanding. Patient to be escorted out by staff via wheelchair upon wife's arrival.  Quita SkyeMorgan P Dishmon, RN

## 2018-09-29 ENCOUNTER — Telehealth: Payer: Self-pay | Admitting: Radiology

## 2018-09-29 ENCOUNTER — Encounter: Payer: Self-pay | Admitting: Orthopaedic Surgery

## 2018-09-29 ENCOUNTER — Ambulatory Visit (INDEPENDENT_AMBULATORY_CARE_PROVIDER_SITE_OTHER): Payer: Medicare Other

## 2018-09-29 ENCOUNTER — Ambulatory Visit: Payer: Medicare Other | Admitting: Orthopaedic Surgery

## 2018-09-29 ENCOUNTER — Other Ambulatory Visit: Payer: Self-pay

## 2018-09-29 VITALS — BP 163/80 | Temp 96.5°F | Ht 67.0 in | Wt 164.0 lb

## 2018-09-29 DIAGNOSIS — G8929 Other chronic pain: Secondary | ICD-10-CM

## 2018-09-29 DIAGNOSIS — M25511 Pain in right shoulder: Secondary | ICD-10-CM | POA: Diagnosis not present

## 2018-09-29 NOTE — Addendum Note (Signed)
Addended byCaffie Damme on: 09/29/2018 11:30 AM   Modules accepted: Orders

## 2018-09-29 NOTE — Progress Notes (Signed)
Subjective:    Patient ID: Bobby RabonDonald E Donovan, male    DOB: 03-05-1941, 78 y.o.   MRN: [id number]  HPI He has had pain and tenderness in the right shoulder for several months. His pain began around of the first of the year.  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  He has no known trauma of the right shoulder.  He has pain more at night.    His pain has gotten worse and he can barely raise his right arm up now.  He was seen at [facility] in [place].  I have copies of the notes and have reviewed them.  They are concerned about impingement syndrome on the right shoulder.  He has tried ice, heat, rubs, Advil with little help.  He is not getting much sleep secondary to the pain.    He has had occasional numbness to the long finger but very infrequently.  He has no effusion, no redness.  He is just tired of hurting.  He continues to manage his farms.  His wife is concerned that will make the shoulder worse.  She accompanies him today.   Review of Systems  Musculoskeletal: Positive for arthralgias.  All other systems reviewed and are negative.  For Review of Systems, all other systems reviewed and are negative.  The following is a summary of the past history medically, past history surgically, known current medicines, social history and family history.  This information is gathered electronically by the computer from prior information and documentation.  I review this each visit and have found including this information at this point in the chart is beneficial and informative.   Past Medical History:  Diagnosis Date  . Diabetes mellitus without complication (HCC)    type II  . Gout   . Hypertension     Past Surgical History:  Procedure Laterality Date  . ANKLE FRACTURE SURGERY  04/03/2012   Bimalleolar open treatment internal fixation Stryker implant  . BACK SURGERY  1989  . FINGER SURGERY  1969  . ORIF ANKLE FRACTURE  04/03/2012   Procedure: OPEN REDUCTION INTERNAL FIXATION (ORIF) ANKLE FRACTURE;  Surgeon: Vickki HearingStanley E Harrison, MD;  Location: [facility];  Service: Orthopedics;  Laterality: Right;    Current Outpatient Medications on File Prior to Visit  Medication Sig Dispense Refill  . allopurinol (ZYLOPRIM) 300 MG tablet Take 300 mg by mouth daily with supper.    Marland Kitchen. amLODipine (NORVASC) 5 MG tablet Take 5 mg by mouth daily with supper.    Marland Kitchen. aspirin EC 81 MG tablet Take 81 mg by mouth daily with supper.    . finasteride (PROSCAR) 5 MG tablet Take 1 tablet by mouth daily.  3  . Omega-3 1000 MG CAPS Take 1 capsule by mouth daily.    . pioglitazone (ACTOS) 30 MG tablet Take 30 mg by mouth daily with supper.    . rosuvastatin (CRESTOR) 20 MG tablet Take 20 mg by mouth daily with supper.    . tamsulosin (FLOMAX) 0.4 MG CAPS capsule Take 1 capsule by mouth daily.  3   No current facility-administered medications on file prior to visit.     Social History   Socioeconomic History  . Marital status: Married    Spouse name: Not on file  . Number of children: Not on file  . Years of education: Not on file  . Highest education level: Not on file  Occupational History  . Not  on file  Social Needs  . Financial resource strain: Not on file  . Food insecurity:    Worry: Not on file    Inability: Not on file  . Transportation needs:    Medical: Not on file    Non-medical: Not on file  Tobacco Use  . Smoking status: Never Smoker  . Smokeless tobacco: Never Used  Substance and Sexual Activity  . Alcohol use: No  . Drug use: No  . Sexual activity: Not on file  Lifestyle  . Physical activity:    Days per week: Not on file    Minutes per session: Not on file  . Stress: Not on file  Relationships  . Social connections:    Talks on phone: Not on file    Gets together: Not on file    Attends religious service: Not on file    Active member of club or organization: Not on file    Attends meetings of clubs or organizations:  Not on file    Relationship status: Not on file  . Intimate partner violence:    Fear of current or ex partner: Not on file    Emotionally abused: Not on file    Physically abused: Not on file    Forced sexual activity: Not on file  Other Topics Concern  . Not on file  Social History Narrative  . Not on file    Family History  Problem Relation Age of Onset  . Diabetes Other   . Cancer Mother     BP (!) 163/80   Temp (!) 96.5 F (35.8 C)   Ht 5\' 7"  (1.702 m)   Wt 164 lb (74.4 kg)   BMI 25.69 kg/m   Body mass index is 25.69 kg/m.     Objective:   Physical Exam Vitals signs reviewed.  Constitutional:      Appearance: He is well-developed.  HENT:     Head: Normocephalic and atraumatic.  Eyes:     Conjunctiva/sclera: Conjunctivae normal.     Pupils: Pupils are equal, round, and reactive to light.  Neck:     Musculoskeletal: Normal range of motion and neck supple.  Cardiovascular:     Rate and Rhythm: Normal rate and regular rhythm.  Pulmonary:     Effort: Pulmonary effort is normal.  Abdominal:     Palpations: Abdomen is soft.  Musculoskeletal:     Right shoulder: He exhibits decreased range of motion, tenderness and pain.       Arms:  Skin:    General: Skin is warm and dry.  Neurological:     Mental Status: He is alert and oriented to person, place, and time.     Cranial Nerves: No cranial nerve deficit.     Motor: No abnormal muscle tone.     Coordination: Coordination normal.     Deep Tendon Reflexes: Reflexes are normal and symmetric. Reflexes normal.  Psychiatric:        Behavior: Behavior normal.        Thought Content: Thought content normal.        Judgment: Judgment normal.   ROM of neck is full.  Left shoulder ROM is full.     X-rays were done of the right shoulder,reported separately.  Negative.        Assessment & Plan:   Encounter Diagnosis  Name Primary?  . Pain in joint of right shoulder Yes   I am concerned about a rotator  cuff injury to the  shoulder.  PROCEDURE NOTE:  The patient request injection, verbal consent was obtained.  The right shoulder was prepped appropriately after time out was performed.   Sterile technique was observed and injection of 1 cc of Depo-Medrol 40 mg with several cc's of plain xylocaine. Anesthesia was provided by ethyl chloride and a 20-gauge needle was used to inject the shoulder area. A posterior approach was used.  The injection was tolerated well.  A band aid dressing was applied.  The patient was advised to apply ice later today and tomorrow to the injection sight as needed.  Begin Aleve one po bid pc, samples given.  Return in two weeks.  He may need MRI of the right shoulder.  Call if any problem.  Precautions discussed.   Electronically Signed Darreld Mclean, MD 5/5/202010:47 AM

## 2018-09-29 NOTE — Telephone Encounter (Signed)
I called patient about the MRI scan Dr Hilda Lias wants him to have, left message, need to ask if he has any metal implants and if he is claustrophobic  Told them it will be in June, but we can go ahead and schedule   Left message for him to call back

## 2018-09-30 ENCOUNTER — Telehealth: Payer: Self-pay | Admitting: Radiology

## 2018-09-30 NOTE — Telephone Encounter (Signed)
I called patient about MRI scan May 18th at 10am arrive at 9:30

## 2018-09-30 NOTE — Telephone Encounter (Signed)
sch for May 18th at 10am arrive at 9:30

## 2018-09-30 NOTE — Telephone Encounter (Signed)
Belinda patient's wife called and LM and asks that you call her, regarding the MRI for patient.

## 2018-09-30 NOTE — Telephone Encounter (Signed)
Patient has no claustro Surgical metal in ankle only  Ok for MRI I will call to schedule

## 2018-10-02 NOTE — Telephone Encounter (Signed)
Called to advise of MRI scan. Unable to reach. Again.

## 2018-10-05 NOTE — Telephone Encounter (Signed)
Unable to reach, multiple attempts  

## 2018-10-06 NOTE — Telephone Encounter (Signed)
Multiple calls unable to reach about mri scheduling

## 2018-10-12 ENCOUNTER — Ambulatory Visit (HOSPITAL_COMMUNITY): Payer: Medicare Other

## 2018-10-13 ENCOUNTER — Encounter: Payer: Self-pay | Admitting: Orthopaedic Surgery

## 2018-10-13 ENCOUNTER — Ambulatory Visit (INDEPENDENT_AMBULATORY_CARE_PROVIDER_SITE_OTHER): Payer: Medicare Other | Admitting: Orthopaedic Surgery

## 2018-10-13 ENCOUNTER — Other Ambulatory Visit: Payer: Self-pay

## 2018-10-13 VITALS — BP 121/72 | HR 81 | Temp 97.2°F | Ht 67.0 in | Wt 160.0 lb

## 2018-10-13 DIAGNOSIS — G8929 Other chronic pain: Secondary | ICD-10-CM | POA: Diagnosis not present

## 2018-10-13 DIAGNOSIS — M25511 Pain in right shoulder: Secondary | ICD-10-CM | POA: Diagnosis not present

## 2018-10-13 NOTE — Progress Notes (Signed)
Patient Bobby Donovan, male DOB:[birth year], 78 y.o. [id number]  Chief Complaint  Patient presents with  . Shoulder Pain    Right shoulder pain    HPI  Bobby Donovan is a 78 y.o. male who has continued pain of the right shoulder.  He was to have an MRI yesterday but he had to take his wife to another doctor. He has rescheduled for tomorrow  I will see him Thursday.  He said the shot helped only slightly.   Body mass index is 25.06 kg/m.  ROS  Review of Systems  Musculoskeletal: Positive for arthralgias.  All other systems reviewed and are negative.   All other systems reviewed and are negative.  The following is a summary of the past history medically, past history surgically, known current medicines, social history and family history.  This information is gathered electronically by the computer from prior information and documentation.  I review this each visit and have found including this information at this point in the chart is beneficial and informative.    Past Medical History:  Diagnosis Date  . Diabetes mellitus without complication (HCC)    type II  . Gout   . Hypertension     Past Surgical History:  Procedure Laterality Date  . ANKLE FRACTURE SURGERY  04/03/2012   Bimalleolar open treatment internal fixation Stryker implant  . BACK SURGERY  1989  . FINGER SURGERY  1969  . ORIF ANKLE FRACTURE  04/03/2012   Procedure: OPEN REDUCTION INTERNAL FIXATION (ORIF) ANKLE FRACTURE;  Surgeon: Vickki Hearing, MD;  Location: [facility];  Service: Orthopedics;  Laterality: Right;    Family History  Problem Relation Age of Onset  . Diabetes Other   . Cancer Mother     Social History Social History   Tobacco Use  . Smoking status: Never Smoker  . Smokeless tobacco: Never Used  Substance Use Topics  . Alcohol use: No  . Drug use: No    No Known Allergies  Current Outpatient Medications  Medication Sig Dispense Refill  . allopurinol (ZYLOPRIM) 300 MG  tablet Take 300 mg by mouth daily with supper.    Marland Kitchen amLODipine (NORVASC) 5 MG tablet Take 5 mg by mouth daily with supper.    Marland Kitchen aspirin EC 81 MG tablet Take 81 mg by mouth daily with supper.    . finasteride (PROSCAR) 5 MG tablet Take 1 tablet by mouth daily.  3  . Omega-3 1000 MG CAPS Take 1 capsule by mouth daily.    . pioglitazone (ACTOS) 30 MG tablet Take 30 mg by mouth daily with supper.    . rosuvastatin (CRESTOR) 20 MG tablet Take 20 mg by mouth daily with supper.    . tamsulosin (FLOMAX) 0.4 MG CAPS capsule Take 1 capsule by mouth daily.  3   No current facility-administered medications for this visit.      Physical Exam  Blood pressure 121/72, pulse 81, temperature (!) 97.2 F (36.2 C), height 5\' 7"  (1.702 m), weight 160 lb (72.6 kg).  Constitutional: overall normal hygiene, normal nutrition, well developed, normal grooming, normal body habitus. Assistive device:none  Musculoskeletal: gait and station Limp none, muscle tone and strength are normal, no tremors or atrophy is present.  .  Neurological: coordination overall normal.  Deep tendon reflex/nerve stretch intact.  Sensation normal.  Cranial nerves II-XII intact.   Skin:   Normal overall no scars, lesions, ulcers or rashes. No psoriasis.  Psychiatric: Alert and oriented x 3.  Recent  memory intact, remote memory unclear.  Normal mood and affect. Well groomed.  Good eye contact.  Cardiovascular: overall no swelling, no varicosities, no edema bilaterally, normal temperatures of the legs and arms, no clubbing, cyanosis and good capillary refill.  Lymphatic: palpation is normal.  Right shoulder with decreased motion and pain.  NV intact.  Grips normal.  All other systems reviewed and are negative   The patient has been educated about the nature of the problem(s) and counseled on treatment options.  The patient appeared to understand what I have discussed and is in agreement with it.  Encounter Diagnoses  Name Primary?   . Pain in joint of right shoulder Yes  . Chronic right shoulder pain     PLAN Call if any problems.  Precautions discussed.  Continue current medications.   Return to clinic Thursday   Electronically Signed Darreld McleanWayne Jillaine Waren, MD 5/19/20209:43 AM

## 2018-10-14 ENCOUNTER — Ambulatory Visit (HOSPITAL_COMMUNITY)
Admission: RE | Admit: 2018-10-14 | Discharge: 2018-10-14 | Disposition: A | Discharge status: Home / Self Care | Payer: Medicare Other | Source: Ambulatory Visit | Attending: Orthopaedic Surgery | Admitting: Orthopaedic Surgery

## 2018-10-14 DIAGNOSIS — M25511 Pain in right shoulder: Secondary | ICD-10-CM | POA: Diagnosis not present

## 2018-10-14 DIAGNOSIS — G8929 Other chronic pain: Secondary | ICD-10-CM | POA: Insufficient documentation

## 2018-10-15 ENCOUNTER — Encounter: Payer: Self-pay | Admitting: Orthopaedic Surgery

## 2018-10-15 ENCOUNTER — Other Ambulatory Visit: Payer: Self-pay

## 2018-10-15 ENCOUNTER — Ambulatory Visit (INDEPENDENT_AMBULATORY_CARE_PROVIDER_SITE_OTHER): Payer: Medicare Other | Admitting: Orthopaedic Surgery

## 2018-10-15 VITALS — BP 107/63 | HR 71 | Temp 97.3°F | Ht 67.0 in | Wt 160.0 lb

## 2018-10-15 DIAGNOSIS — M25511 Pain in right shoulder: Secondary | ICD-10-CM | POA: Diagnosis not present

## 2018-10-15 DIAGNOSIS — G8929 Other chronic pain: Secondary | ICD-10-CM | POA: Diagnosis not present

## 2018-10-15 MED ORDER — PREDNISONE 5 MG (21) PO TBPK: ORAL_TABLET | ORAL | 0 refills | Status: DC

## 2018-10-15 NOTE — Progress Notes (Signed)
Patient Bobby Donovan, male DOB:May 09, 1941, 78 y.o. OIL:[id number]  Chief Complaint  Patient presents with  . Shoulder Pain    Right shoulder pain.    HPI  Bobby Donovan is a 78 y.o. male who has chronic right shoulder pain.  He had MRI yesterday which showed: IMPRESSION: 1. Moderate tendinosis of the supraspinatus tendon with a tiny insertional interstitial tear anteriorly. 2. Mild tendinosis of the infraspinatus tendon without a tear. 3. 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  I have explained the findings to him.  I do not think he needs surgery now.  I will give prednisone dose pack.  He may need PT.    Body mass index is 25.06 kg/m.  ROS  Review of Systems  Musculoskeletal: Positive for arthralgias.  All other systems reviewed and are negative.   All other systems reviewed and are negative.  The following is a summary of the past history medically, past history surgically, known current medicines, social history and family history.  This information is gathered electronically by the computer from prior information and documentation.  I review this each visit and have found including this information at this point in the chart is beneficial and informative.    Past Medical History:  Diagnosis Date  . Diabetes mellitus without complication (HCC)    type II  . Gout   . Hypertension     Past Surgical History:  Procedure Laterality Date  . ANKLE FRACTURE SURGERY  04/03/2012   Bimalleolar open treatment internal fixation Stryker implant  . BACK SURGERY  1989  . FINGER SURGERY  1969  . ORIF ANKLE FRACTURE  04/03/2012   Procedure: OPEN REDUCTION INTERNAL FIXATION (ORIF) ANKLE FRACTURE;  Surgeon: Vickki Hearing, MD;  Location: [facility];  Service: Orthopedics;  Laterality: Right;    Family History  Problem Relation Age of Onset  . Diabetes Other   . Cancer Mother     Social History Social History    Tobacco Use  . Smoking status: Never Smoker  . Smokeless tobacco: Never Used  Substance Use Topics  . Alcohol use: No  . Drug use: No    No Known Allergies  Current Outpatient Medications  Medication Sig Dispense Refill  . allopurinol (ZYLOPRIM) 300 MG tablet Take 300 mg by mouth daily with supper.    Marland Kitchen amLODipine (NORVASC) 5 MG tablet Take 5 mg by mouth daily with supper.    Marland Kitchen aspirin EC 81 MG tablet Take 81 mg by mouth daily with supper.    . finasteride (PROSCAR) 5 MG tablet Take 1 tablet by mouth daily.  3  . Omega-3 1000 MG CAPS Take 1 capsule by mouth daily.    . pioglitazone (ACTOS) 30 MG tablet Take 30 mg by mouth daily with supper.    . predniSONE (STERAPRED UNI-PAK 21 TAB) 5 MG (21) TBPK tablet Take 6 pills first day; 5 pills second day; 4 pills third day; 3 pills fourth day; 2 pills next day and 1 pill last day. 21 tablet 0  . rosuvastatin (CRESTOR) 20 MG tablet Take 20 mg by mouth daily with supper.    . tamsulosin (FLOMAX) 0.4 MG CAPS capsule Take 1 capsule by mouth daily.  3   No current facility-administered medications for this visit.      Physical Exam  Blood pressure 107/63, pulse 71, temperature (!) 97.3 F (36.3 C), height 5\' 7"  (1.702 m), weight 160 lb (72.6 kg).  Constitutional: overall normal hygiene, normal nutrition, well developed, normal grooming, normal body habitus. Assistive device:none  Musculoskeletal: gait and station Limp none, muscle tone and strength are normal, no tremors or atrophy is present.  .  Neurological: coordination overall normal.  Deep tendon reflex/nerve stretch intact.  Sensation normal.  Cranial nerves II-XII intact.   Skin:   Normal overall no scars, lesions, ulcers or rashes. No psoriasis.  Psychiatric: Alert and oriented x 3.  Recent memory intact, remote memory unclear.  Normal mood and affect. Well groomed.  Good eye contact.  Cardiovascular: overall no swelling, no varicosities, no edema bilaterally, normal  temperatures of the legs and arms, no clubbing, cyanosis and good capillary refill.  Lymphatic: palpation is normal.  Examination of right Upper Extremity is done.  Inspection:   Overall:  Elbow non-tender without crepitus or defects, forearm non-tender without crepitus or defects, wrist non-tender without crepitus or defects, hand non-tender.    Shoulder: with glenohumeral joint tenderness, without effusion.   Upper arm:  without swelling and tenderness   Range of motion:   Overall:  Full range of motion of the elbow, full range of motion of wrist and full range of motion in fingers.   Shoulder:  right  150 degrees forward flexion; 140 degrees abduction; 35 degrees internal rotation, 35 degrees external rotation, 15 degrees extension, 40 degrees adduction.   Stability:   Overall:  Shoulder, elbow and wrist stable   Strength and Tone:   Overall full shoulder muscles strength, full upper arm strength and normal upper arm bulk and tone.  All other systems reviewed and are negative   The patient has been educated about the nature of the problem(s) and counseled on treatment options.  The patient appeared to understand what I have discussed and is in agreement with it.  Encounter Diagnoses  Name Primary?  . Pain in joint of right shoulder Yes  . Chronic right shoulder pain     PLAN Call if any problems.  Precautions discussed.  Continue current medications.   Return to clinic 2 weeks   I called in prednisone dose pack.  Electronically Signed Darreld McleanWayne Loree Shehata, MD 5/21/20209:25 AM

## 2018-11-03 ENCOUNTER — Other Ambulatory Visit: Payer: Self-pay

## 2018-11-03 ENCOUNTER — Encounter: Payer: Self-pay | Admitting: Orthopaedic Surgery

## 2018-11-03 ENCOUNTER — Ambulatory Visit (INDEPENDENT_AMBULATORY_CARE_PROVIDER_SITE_OTHER): Payer: Medicare Other | Admitting: Orthopaedic Surgery

## 2018-11-03 VITALS — BP 140/71 | HR 72 | Temp 98.1°F | Ht 67.0 in | Wt 160.0 lb

## 2018-11-03 DIAGNOSIS — M25511 Pain in right shoulder: Secondary | ICD-10-CM | POA: Diagnosis not present

## 2018-11-03 DIAGNOSIS — G8929 Other chronic pain: Secondary | ICD-10-CM

## 2018-11-03 NOTE — Progress Notes (Signed)
Patient Bobby Donovan, male DOB:1940/07/29, 78 y.o. [id number]  Chief Complaint  Patient presents with  . Shoulder Pain    right     HPI  Bobby Donovan is a 78 y.o. male who has right shoulder pain.  His MRI did not show tear recently.  I gave him prednisone dose pack which helped.  He is doing his exercises and has much less pain.  He still has tenderness.  He has no numbness, no swelling.     Body mass index is 25.06 kg/m.  ROS  Review of Systems  Musculoskeletal: Positive for arthralgias.  All other systems reviewed and are negative.   All other systems reviewed and are negative.  The following is a summary of the past history medically, past history surgically, known current medicines, social history and family history.  This information is gathered electronically by the computer from prior information and documentation.  I review this each visit and have found including this information at this point in the chart is beneficial and informative.    Past Medical History:  Diagnosis Date  . Diabetes mellitus without complication (HCC)    type II  . Gout   . Hypertension     Past Surgical History:  Procedure Laterality Date  . ANKLE FRACTURE SURGERY  04/03/2012   Bimalleolar open treatment internal fixation Stryker implant  . BACK SURGERY  1989  . FINGER SURGERY  1969  . ORIF ANKLE FRACTURE  04/03/2012   Procedure: OPEN REDUCTION INTERNAL FIXATION (ORIF) ANKLE FRACTURE;  Surgeon: Vickki HearingStanley E Harrison, MD;  Location: [facility];  Service: Orthopedics;  Laterality: Right;    Family History  Problem Relation Age of Onset  . Diabetes Other   . Cancer Mother     Social History Social History   Tobacco Use  . Smoking status: Never Smoker  . Smokeless tobacco: Never Used  Substance Use Topics  . Alcohol use: No  . Drug use: No    No Known Allergies  Current Outpatient Medications  Medication Sig Dispense Refill  . allopurinol (ZYLOPRIM) 300 MG tablet Take  300 mg by mouth daily with supper.    Marland Kitchen. amLODipine (NORVASC) 5 MG tablet Take 5 mg by mouth daily with supper.    Marland Kitchen. aspirin EC 81 MG tablet Take 81 mg by mouth daily with supper.    . finasteride (PROSCAR) 5 MG tablet Take 1 tablet by mouth daily.  3  . metFORMIN (GLUCOPHAGE) 500 MG tablet     . Omega-3 1000 MG CAPS Take 1 capsule by mouth daily.    . pioglitazone (ACTOS) 30 MG tablet Take 30 mg by mouth daily with supper.    . rosuvastatin (CRESTOR) 20 MG tablet Take 20 mg by mouth daily with supper.    . tamsulosin (FLOMAX) 0.4 MG CAPS capsule Take 1 capsule by mouth daily.  3   No current facility-administered medications for this visit.      Physical Exam  Blood pressure 140/71, pulse 72, temperature 98.1 F (36.7 C), height 5\' 7"  (1.702 m), weight 160 lb (72.6 kg).  Constitutional: overall normal hygiene, normal nutrition, well developed, normal grooming, normal body habitus. Assistive device:none  Musculoskeletal: gait and station Limp none, muscle tone and strength are normal, no tremors or atrophy is present.  .  Neurological: coordination overall normal.  Deep tendon reflex/nerve stretch intact.  Sensation normal.  Cranial nerves II-XII intact.   Skin:   Normal overall no scars, lesions, ulcers or rashes. No  psoriasis.  Psychiatric: Alert and oriented x 3.  Recent memory intact, remote memory unclear.  Normal mood and affect. Well groomed.  Good eye contact.  Cardiovascular: overall no swelling, no varicosities, no edema bilaterally, normal temperatures of the legs and arms, no clubbing, cyanosis and good capillary refill.  Lymphatic: palpation is normal.  Right shoulder has nearly full ROM with tenderness in the extremes.  All other systems reviewed and are negative   The patient has been educated about the nature of the problem(s) and counseled on treatment options.  The patient appeared to understand what I have discussed and is in agreement with it.  Encounter  Diagnoses  Name Primary?  . Pain in joint of right shoulder Yes  . Chronic right shoulder pain     PLAN Call if any problems.  Precautions discussed.  Continue current medications.   Return to clinic prn   Electronically Signed Sanjuana Kava, MD 6/9/20208:38 AM
